# Patient Record
Sex: Male | Born: 1945 | Race: White | Hispanic: No | Marital: Single | Smoking: Never smoker
Health system: Southern US, Community
[De-identification: ages and names within clinical notes are randomized; demographics above are authoritative.]

## PROBLEM LIST (undated history)

## (undated) DIAGNOSIS — C801 Malignant (primary) neoplasm, unspecified: Secondary | ICD-10-CM

## (undated) DIAGNOSIS — I10 Essential (primary) hypertension: Secondary | ICD-10-CM

## (undated) DIAGNOSIS — E119 Type 2 diabetes mellitus without complications: Secondary | ICD-10-CM

---

## 2013-03-19 ENCOUNTER — Other Ambulatory Visit (HOSPITAL_COMMUNITY)
Admission: RE | Admit: 2013-03-19 | Discharge: 2013-03-19 | Disposition: A | Discharge status: Home / Self Care | Payer: Medicare Other | Source: Ambulatory Visit | Attending: Oncology | Admitting: Oncology

## 2013-03-19 DIAGNOSIS — D72820 Lymphocytosis (symptomatic): Secondary | ICD-10-CM | POA: Insufficient documentation

## 2015-05-22 ENCOUNTER — Telehealth: Payer: Self-pay

## 2015-05-22 DIAGNOSIS — I714 Abdominal aortic aneurysm, without rupture, unspecified: Secondary | ICD-10-CM

## 2015-05-22 NOTE — Telephone Encounter (Signed)
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 They spoke to a cardiologist and their office will contact patient and get him seen this week but Bridgitte said she was not sure if patient will understand the importance and actually go to that appointment, she states that cardiologist will be out of town next week and asked [place] to check on patient next week to make sure he went and if not if Dr. Rosanna Randy can see him for this issue. Will keep this message and call patient next week to make sure. -aa

## 2015-05-27 NOTE — Telephone Encounter (Signed)
Rodney Burton, Dr. Rosanna Randy said he has a cardiologist, it is Dr. Nehemiah Massed.  They have an appointment with him on July 18, do you think we can get anything sooner.

## 2015-05-28 ENCOUNTER — Telehealth: Payer: Self-pay

## 2015-05-28 NOTE — Telephone Encounter (Signed)
Ana,I think you may have put an order in for wrong pt.This pt has never been in our office

## 2015-05-28 NOTE — Telephone Encounter (Signed)
See below

## 2015-05-28 NOTE — Telephone Encounter (Signed)
Please refer to vascular surgeon for abdominal aneyrism, thank you-aa

## 2016-11-03 DIAGNOSIS — C9110 Chronic lymphocytic leukemia of B-cell type not having achieved remission: Secondary | ICD-10-CM | POA: Diagnosis not present

## 2016-11-03 DIAGNOSIS — M6281 Muscle weakness (generalized): Secondary | ICD-10-CM | POA: Diagnosis not present

## 2017-05-12 DIAGNOSIS — C9110 Chronic lymphocytic leukemia of B-cell type not having achieved remission: Secondary | ICD-10-CM | POA: Diagnosis not present

## 2017-11-07 DIAGNOSIS — C9110 Chronic lymphocytic leukemia of B-cell type not having achieved remission: Secondary | ICD-10-CM

## 2018-04-03 ENCOUNTER — Ambulatory Visit: Payer: Medicare Other | Admitting: Podiatry

## 2018-05-01 ENCOUNTER — Ambulatory Visit: Payer: Medicare Other | Admitting: Podiatry

## 2018-05-05 ENCOUNTER — Encounter: Payer: Self-pay | Admitting: Sports Medicine

## 2018-05-05 ENCOUNTER — Ambulatory Visit (INDEPENDENT_AMBULATORY_CARE_PROVIDER_SITE_OTHER): Payer: Medicare Other | Admitting: Sports Medicine

## 2018-05-05 VITALS — BP 104/68 | HR 96 | Temp 98.7°F | Resp 16

## 2018-05-05 DIAGNOSIS — M79675 Pain in left toe(s): Secondary | ICD-10-CM | POA: Diagnosis not present

## 2018-05-05 DIAGNOSIS — B351 Tinea unguium: Secondary | ICD-10-CM

## 2018-05-05 DIAGNOSIS — E119 Type 2 diabetes mellitus without complications: Secondary | ICD-10-CM | POA: Diagnosis not present

## 2018-05-05 DIAGNOSIS — B359 Dermatophytosis, unspecified: Secondary | ICD-10-CM

## 2018-05-05 DIAGNOSIS — M79674 Pain in right toe(s): Secondary | ICD-10-CM | POA: Diagnosis not present

## 2018-05-05 MED ORDER — TERBINAFINE HCL 1 % EX SOLN: CUTANEOUS | 5 refills | Status: AC

## 2018-05-05 NOTE — Progress Notes (Signed)
   Subjective:    Patient ID: Rodney Burton, male    DOB: Jun 26, 1946, 72 y.o.   MRN: [id number]  HPI    Review of Systems  Musculoskeletal: Positive for gait problem.  All other systems reviewed and are negative.      Objective:   Physical Exam        Assessment & Plan:

## 2018-05-05 NOTE — Progress Notes (Signed)
Subjective: Rodney Burton is a 72 y.o. male patient with history of diabetes who presents to office today complaining of long,mildly painful nails  while ambulating in shoes; unable to trim. Patient does not know his blood sugar or last visit to his primary care doctor.  Patient is assisted by facility caregiver who assists with providing information that patient is in the diabetic.  Patient admits that he has difficulty with walking and standing that is why he uses a walker however denies any other pedal complaints or concerns at this time.  Review of Systems  Musculoskeletal:       Gait problem uses walker  All other systems reviewed and are negative.   There are no active problems to display for this patient.  Current Outpatient Medications on File Prior to Visit  Medication Sig Dispense Refill  . acetaminophen (TYLENOL) 500 MG tablet Take 500 mg by mouth every 6 (six) hours as needed.    Marland Kitchen allopurinol (ZYLOPRIM) 100 MG tablet Take 100 mg by mouth daily.    Marland Kitchen ALPRAZolam (XANAX) 0.5 MG tablet Take 0.5 mg by mouth at bedtime as needed for anxiety.    Marland Kitchen aspirin EC 81 MG tablet Take 81 mg by mouth daily.    . clotrimazole (LOTRIMIN) 1 % cream Apply 1 application topically 2 (two) times daily.    . ergocalciferol (VITAMIN D2) 50000 units capsule Take 50,000 Units by mouth once a week.    Marland Kitchen lisinopril (PRINIVIL,ZESTRIL) 20 MG tablet Take 20 mg by mouth daily.    . metFORMIN (GLUCOPHAGE) 500 MG tablet Take by mouth 2 (two) times daily with a meal.    . metoprolol succinate (TOPROL-XL) 25 MG 24 hr tablet Take 25 mg by mouth daily.    . polycarbophil (FIBERCON) 625 MG tablet Take 625 mg by mouth daily.     No current facility-administered medications on file prior to visit.    Allergies not on file  No results found for this or any previous visit (from the past 2160 hour(s)).  Objective: General: Patient is awake, alert, and oriented x 3 and in no acute distress.  Integument: Skin is warm,  dry and supple bilateral. Nails are tender, long, thickened and  dystrophic with subungual debris, consistent with onychomycosis, 1-5 bilateral.  There is a scaly rash to both feet and lower legs in a moccasin distribution consistent with tinea.  No signs of infection. No open lesions or preulcerative lesions present bilateral.  Mild reactive callusing plantar aspect of right foot and distal tuft of left second toe.  Remaining integument unremarkable.  Vasculature:  Dorsalis Pedis pulse 1/4 bilateral. Posterior Tibial pulse 1 /4 bilateral.  Capillary fill time <3 sec 1-5 bilateral. Positive hair growth to the level of the digits. Temperature gradient within normal limits. No varicosities present bilateral. No edema present bilateral.   Neurology: Gross pedal sensation intact via light touch bilateral.  Musculoskeletal: Asymptomatic hammertoe pedal deformities noted bilateral. Muscular strength 5/5 in all lower extremity muscular groups bilateral without pain on range of motion . No tenderness with calf compression bilateral.  Assessment and Plan: Problem List Items Addressed This Visit    None    Visit Diagnoses    Pain due to onychomycosis of toenails of both feet    -  Primary   Relevant Medications   clotrimazole (LOTRIMIN) 1 % cream   Terbinafine HCl 1 % SOLN   Tinea       Relevant Medications   clotrimazole (LOTRIMIN) 1 %  cream   Terbinafine HCl 1 % SOLN   Diabetes mellitus without complication (HCC)       Relevant Medications   metFORMIN (GLUCOPHAGE) 500 MG tablet   aspirin EC 81 MG tablet   lisinopril (PRINIVIL,ZESTRIL) 20 MG tablet     -Examined patient. -Discussed and educated patient on diabetic foot care, especially with  regards to the vascular, neurological and musculoskeletal systems.  -Stressed the importance of good glycemic control and the detriment of not  controlling glucose levels in relation to the foot. -Mechanically debrided all nails 1-5 bilateral using  sterile nail nipper and filed with dremel without incident  -Prescribed Lamisil spray to use at bedtime for likely tinea to both feet and lower extremities -Continue with walker for stability and gait and good supportive shoes -Patient to return  in 3 months for at risk foot care -Patient advised to call the office if any problems or questions arise in the meantime.  Landis Martins, DPM

## 2018-05-08 DIAGNOSIS — C9110 Chronic lymphocytic leukemia of B-cell type not having achieved remission: Secondary | ICD-10-CM

## 2018-08-04 ENCOUNTER — Ambulatory Visit: Payer: Medicare Other | Admitting: Sports Medicine

## 2018-08-30 ENCOUNTER — Encounter: Payer: Self-pay | Admitting: Sports Medicine

## 2018-08-30 ENCOUNTER — Ambulatory Visit (INDEPENDENT_AMBULATORY_CARE_PROVIDER_SITE_OTHER): Payer: Medicare Other | Admitting: Sports Medicine

## 2018-08-30 VITALS — BP 73/44 | HR 90 | Temp 96.4°F | Resp 16

## 2018-08-30 DIAGNOSIS — M79671 Pain in right foot: Secondary | ICD-10-CM

## 2018-08-30 DIAGNOSIS — B351 Tinea unguium: Secondary | ICD-10-CM

## 2018-08-30 DIAGNOSIS — E119 Type 2 diabetes mellitus without complications: Secondary | ICD-10-CM | POA: Diagnosis not present

## 2018-08-30 DIAGNOSIS — M79675 Pain in left toe(s): Secondary | ICD-10-CM | POA: Diagnosis not present

## 2018-08-30 DIAGNOSIS — L97511 Non-pressure chronic ulcer of other part of right foot limited to breakdown of skin: Secondary | ICD-10-CM

## 2018-08-30 DIAGNOSIS — M79674 Pain in right toe(s): Secondary | ICD-10-CM

## 2018-08-30 NOTE — Progress Notes (Signed)
Subjective: Rodney Burton is a 72 y.o. male patient with history of diabetes who presents to office today for evaluation of possible wound at the bottom of the right foot and long,mildly painful nails  while ambulating in shoes; unable to trim. Patient does not know his blood sugar or last visit to his primary care doctor.  Patient is assisted by facility caregiver who assists with providing information that patient is in the diabetic and she will be calling primary care today for recommendation on his low blood pressure.  Facility caregiver reports that nursing staff has been applying a Band-Aid on the right foot but there has not been any drainage redness warmth swelling or any other signs of infection at the right foot.  No other pedal complaints at this time.   There are no active problems to display for this patient.  Current Outpatient Medications on File Prior to Visit  Medication Sig Dispense Refill  . acetaminophen (TYLENOL) 500 MG tablet Take 500 mg by mouth every 6 (six) hours as needed.    Marland Kitchen allopurinol (ZYLOPRIM) 100 MG tablet Take 100 mg by mouth daily.    Marland Kitchen ALPRAZolam (XANAX) 0.5 MG tablet Take 0.5 mg by mouth at bedtime as needed for anxiety.    Marland Kitchen aspirin EC 81 MG tablet Take 81 mg by mouth daily.    . clotrimazole (LOTRIMIN) 1 % cream Apply 1 application topically 2 (two) times daily.    . ergocalciferol (VITAMIN D2) 50000 units capsule Take 50,000 Units by mouth once a week.    Marland Kitchen lisinopril (PRINIVIL,ZESTRIL) 20 MG tablet Take 20 mg by mouth daily.    . metFORMIN (GLUCOPHAGE) 500 MG tablet Take by mouth 2 (two) times daily with a meal.    . metoprolol succinate (TOPROL-XL) 25 MG 24 hr tablet Take 25 mg by mouth daily.    . polycarbophil (FIBERCON) 625 MG tablet Take 625 mg by mouth daily.    . Terbinafine HCl 1 % SOLN Spray to both feet daily at bedtime 125 mL 5   No current facility-administered medications on file prior to visit.    Not on File  No results found for this  or any previous visit (from the past 2160 hour(s)).  Objective: General: Patient is awake, alert, and oriented x 3 and in no acute distress.  Integument: Skin is warm, dry and supple bilateral. Nails are tender, long, thickened and  dystrophic with subungual debris, consistent with onychomycosis, 1-5 bilateral.  There is reactive callus tissue plantar aspect of right foot upon debridement there is a pinpoint ulceration measuring 0.1 x 0.1 x 0.1 with a granular base no surrounding warmth, redness, drainage, or malodor.  Remaining integument unremarkable.  Vasculature:  Dorsalis Pedis pulse 1/4 bilateral. Posterior Tibial pulse 1 /4 bilateral.  Capillary fill time <3 sec 1-5 bilateral. Positive hair growth to the level of the digits. Temperature gradient within normal limits. No varicosities present bilateral. No edema present bilateral.   Neurology: Gross pedal sensation intact via light touch bilateral.  Musculoskeletal: Asymptomatic hammertoe pedal deformities noted bilateral. Muscular strength 5/5 in all lower extremity muscular groups bilateral without pain on range of motion . No tenderness with calf compression bilateral.  Assessment and Plan: Problem List Items Addressed This Visit    None    Visit Diagnoses    Right foot ulcer, limited to breakdown of skin ([place])    -  Primary   [place] submet 2 on right   Diabetes mellitus without complication ([place])  Right foot pain       Pain due to onychomycosis of toenails of both feet         -Examined patient. -Discussed and educated caregivers on diabetic foot care - Excisionally dedbrided ulceration at right plantar forefoot to healthy bleeding borders removing nonviable tissue using a sterile chisel blade. Wound measures post debridement as above.  Wound was debrided to the level of the dermis with viable wound base exposed to promote healing. Hemostasis was achieved with manuel pressure. Patient tolerated procedure well without any  discomfort or anesthesia necessary for this wound debridement.  -Applied antibiotic cream and Band-Aid dressing and advised him to do the same at the facility daily - Advised patient to go to the ER or return to office if the wound worsens or if constitutional symptoms are present. -Mechanically debrided all nails 1-5 bilateral using sterile nail nipper and filed with dremel without incident  -Recommend caregiver to follow-up with PCP about low blood pressure -Continue with walker or wheelchair for stability in gait and good supportive shoes, applied offloading padding to the right insole of shoe -Patient to return in 3 weeks for wound check on right -Patient advised to call the office if any problems or questions arise in the meantime.  Landis Martins, DPM

## 2018-09-03 ENCOUNTER — Emergency Department (HOSPITAL_COMMUNITY)
Admission: EM | Admit: 2018-09-03 | Discharge: 2018-09-03 | Disposition: A | Discharge status: Home / Self Care | Payer: Medicare Other | Attending: Emergency Medicine | Admitting: Emergency Medicine

## 2018-09-03 ENCOUNTER — Emergency Department (HOSPITAL_COMMUNITY): Payer: Medicare Other

## 2018-09-03 ENCOUNTER — Encounter (HOSPITAL_COMMUNITY): Payer: Self-pay | Admitting: Internal Medicine

## 2018-09-03 DIAGNOSIS — Z7982 Long term (current) use of aspirin: Secondary | ICD-10-CM | POA: Insufficient documentation

## 2018-09-03 DIAGNOSIS — I10 Essential (primary) hypertension: Secondary | ICD-10-CM | POA: Diagnosis not present

## 2018-09-03 DIAGNOSIS — F0390 Unspecified dementia without behavioral disturbance: Secondary | ICD-10-CM | POA: Diagnosis not present

## 2018-09-03 DIAGNOSIS — D72820 Lymphocytosis (symptomatic): Secondary | ICD-10-CM | POA: Diagnosis not present

## 2018-09-03 DIAGNOSIS — Z7984 Long term (current) use of oral hypoglycemic drugs: Secondary | ICD-10-CM | POA: Insufficient documentation

## 2018-09-03 DIAGNOSIS — W19XXXA Unspecified fall, initial encounter: Secondary | ICD-10-CM

## 2018-09-03 DIAGNOSIS — E119 Type 2 diabetes mellitus without complications: Secondary | ICD-10-CM | POA: Insufficient documentation

## 2018-09-03 DIAGNOSIS — M79602 Pain in left arm: Secondary | ICD-10-CM | POA: Diagnosis present

## 2018-09-03 DIAGNOSIS — Z79899 Other long term (current) drug therapy: Secondary | ICD-10-CM | POA: Diagnosis not present

## 2018-09-03 HISTORY — DX: Type 2 diabetes mellitus without complications: E11.9

## 2018-09-03 HISTORY — DX: Essential (primary) hypertension: I10

## 2018-09-03 LAB — CBC WITH DIFFERENTIAL/PLATELET
BASOS ABS: 0 10*3/uL (ref 0.0–0.1)
Basophils Relative: 0 %
EOS ABS: 0 10*3/uL (ref 0.0–0.5)
Eosinophils Relative: 0 %
HEMATOCRIT: 36 % — AB (ref 39.0–52.0)
HEMOGLOBIN: 11 g/dL — AB (ref 13.0–17.0)
LYMPHS ABS: 33.8 10*3/uL — AB (ref 0.7–4.0)
LYMPHS PCT: 58 %
MCH: 32 pg (ref 26.0–34.0)
MCHC: 30.6 g/dL (ref 30.0–36.0)
MCV: 104.7 fL — AB (ref 80.0–100.0)
MONOS PCT: 0 %
Monocytes Absolute: 0 10*3/uL — ABNORMAL LOW (ref 0.1–1.0)
NRBC: 0 % (ref 0.0–0.2)
Neutro Abs: 5.9 10*3/uL (ref 1.7–7.7)
Neutrophils Relative %: 10 %
Other: 32 %
Platelets: 173 10*3/uL (ref 150–400)
RBC: 3.44 MIL/uL — ABNORMAL LOW (ref 4.22–5.81)
RDW: 13.2 % (ref 11.5–15.5)
WBC: 58.2 10*3/uL (ref 4.0–10.5)

## 2018-09-03 LAB — COMPREHENSIVE METABOLIC PANEL
ALBUMIN: 3.8 g/dL (ref 3.5–5.0)
ALK PHOS: 37 U/L — AB (ref 38–126)
ALT: 15 U/L (ref 0–44)
AST: 23 U/L (ref 15–41)
Anion gap: 8 (ref 5–15)
BILIRUBIN TOTAL: 0.5 mg/dL (ref 0.3–1.2)
BUN: 15 mg/dL (ref 8–23)
CALCIUM: 9.3 mg/dL (ref 8.9–10.3)
CO2: 24 mmol/L (ref 22–32)
Chloride: 109 mmol/L (ref 98–111)
Creatinine, Ser: 0.78 mg/dL (ref 0.61–1.24)
GFR calc Af Amer: >60 mL/min (ref >60)
Glucose, Bld: 131 mg/dL — ABNORMAL HIGH (ref 70–99)
Potassium: 3.9 mmol/L (ref 3.5–5.1)
Sodium: 141 mmol/L (ref 135–145)
TOTAL PROTEIN: 5.5 g/dL — AB (ref 6.5–8.1)

## 2018-09-03 LAB — URINALYSIS, ROUTINE W REFLEX MICROSCOPIC
BILIRUBIN URINE: NEGATIVE
GLUCOSE, UA: 150 mg/dL — AB
HGB URINE DIPSTICK: NEGATIVE
KETONES UR: NEGATIVE mg/dL
Leukocytes, UA: NEGATIVE
Nitrite: NEGATIVE
PROTEIN: NEGATIVE mg/dL
Specific Gravity, Urine: 1.018 (ref 1.005–1.030)
pH: 5 (ref 5.0–8.0)

## 2018-09-03 MED ORDER — ACETAMINOPHEN 325 MG PO TABS
650.0000 mg | ORAL_TABLET | Freq: Once | ORAL | Status: AC
  Administered 2018-09-03: 650 mg via ORAL
  Filled 2018-09-03: qty 2

## 2018-09-03 NOTE — Discharge Instructions (Addendum)
You evaluation for injury after the fall was negative in the ER today. However, you were found to have elevated white blood cell count and lymphocytes.  This is concerning for possible leukemia.  You need to follow-up with your primary care and cancer doctors for further evaluation. If you are having pain, use Tylenol as needed.  You may use ice or heat to help with musculoskeletal pain. Return to the emergency room with high fevers, change in mental status, or any new or concerning symptoms.

## 2018-09-03 NOTE — ED Notes (Signed)
ED Provider at bedside. 

## 2018-09-03 NOTE — ED Triage Notes (Signed)
Pt here for evaluation of left arm pain that is a result of a fall that happened last night at his nursing facility. Staff at facility were not aware of a fall. Pt reported fall to EMS. Hx of dementia. Pt takes aspirin once a day. No obvious injuries or deformities noted to head/neck.

## 2018-09-03 NOTE — ED Provider Notes (Signed)
[facility] EMERGENCY DEPARTMENT Provider Note   CSN: [id number] Arrival date & time: 09/03/18  1001     History   Chief Complaint No chief complaint on file.   HPI Rodney Burton is a 72 y.o. male presenting for evaluation after a fall.  Level 5 caveat, patient with dementia.  Patient states that he fell last night.  His location of where he hurts varies, occasionally complains of left arm pain.  Occasionally complains of neck pain.  Unable to provide a history due to dementia.  Additional history obtained from Rodney Burton, who works with [facility].  She takes care of the patient.  Per Rodney Burton, patient had a witnessed fall last night when he was getting into bed.  Patient fell backwards, landing on his back.  She denies loss of consciousness.  Patient takes an aspirin daily, is not on blood thinners.  At baseline, patient can stand on his own, but last night and today he appeared weaker.  Rodney Burton states patient has had no obvious complaints or illnesses recently.  No obvious fever.  No obvious cough or vomiting.  Patient was seen in [place] ER last week due to hypotension, both his life lisinopril and metoprolol were discontinued.  Patient has been eating and drinking well.  This morning, they were concerned about patient's blood pressure, 938 systolic.  Heart rate was 126 at the time.  30 minutes later, patient's blood pressure was 101 systolic, heart rate 751.  For this reason, patient was sent to the emergency room. Pt has an appointment with his primary care doctor tomorrow.  Additional history obtained from chart review.  Patient recently seen by podiatry for foot ulcer.  No antibiotics were prescribed.  Patient with a history of diabetes, ?control.   HPI  Past Medical History:  Diagnosis Date  . Diabetes ([place])    type 2  . Hypertension     There are no active problems to display for this patient.   No past surgical history on  file.      Home Medications    Prior to Admission medications   Medication Sig Start Date End Date Taking? Authorizing Provider  acetaminophen (TYLENOL) 500 MG tablet Take 500 mg by mouth every 6 (six) hours as needed.    [provider]  allopurinol (ZYLOPRIM) 100 MG tablet Take 100 mg by mouth daily.    [provider]  ALPRAZolam Duanne Moron) 0.5 MG tablet Take 0.5 mg by mouth at bedtime as needed for anxiety.    [provider]  aspirin EC 81 MG tablet Take 81 mg by mouth daily.    [provider]  clotrimazole (LOTRIMIN) 1 % cream Apply 1 application topically 2 (two) times daily.    [provider]  ergocalciferol (VITAMIN D2) 50000 units capsule Take 50,000 Units by mouth once a week.    [provider]  lisinopril (PRINIVIL,ZESTRIL) 20 MG tablet Take 20 mg by mouth daily.    [provider]  metFORMIN (GLUCOPHAGE) 500 MG tablet Take by mouth 2 (two) times daily with a meal.    [provider]  metoprolol succinate (TOPROL-XL) 25 MG 24 hr tablet Take 25 mg by mouth daily.    [provider]  polycarbophil (FIBERCON) 625 MG tablet Take 625 mg by mouth daily.    [provider]  Terbinafine HCl 1 % SOLN Spray to both feet daily at bedtime 05/05/18   Landis Martins, DPM    Family History No  family history on file.  Social History Social History   Tobacco Use  . Smoking status: Never Smoker  . Smokeless tobacco: Never Used  Substance Use Topics  . Alcohol use: Not on file  . Drug use: Not on file     Allergies   Patient has no allergy information on record.   Review of Systems Review of Systems  Unable to perform ROS: Dementia  Musculoskeletal: Positive for arthralgias and neck pain.  Neurological: Positive for weakness (possible).  Hematological: Does not bruise/bleed easily.     Physical Exam Updated Vital Signs BP (!) 145/85   Pulse 97   Temp 98.1 F (36.7 C) (Oral)    Resp 16   SpO2 94%   Physical Exam  Constitutional: He is oriented to person, place, and time. He appears well-developed and well-nourished. No distress.  Thin elderly male in no acute distress  HENT:  Head: Normocephalic and atraumatic.  No obvious head injury.  Eyes: Pupils are equal, round, and reactive to light. Conjunctivae and EOM are normal.  EOMI and PERRLA  Neck: Normal range of motion. Neck supple.  No tenderness to palpation of midline C-spine.  No obvious step offs or deformities.  Moving head easily and exam.  Cardiovascular: Normal rate, regular rhythm and intact distal pulses.  No tachycardia  Pulmonary/Chest: Effort normal and breath sounds normal. No respiratory distress. He has no wheezes.     He exhibits no tenderness.  Speaking in full sentences.  Clear lung sounds in all fields.  Tenderness palpation of the ribs, chest, or thoracic back.  Patient with abrasion over right scapula.  Abdominal: Soft. He exhibits no distension and no mass. There is no tenderness. There is no guarding.  Genitourinary:  Genitourinary Comments: Diaper rash of the anterior and posterior pelvis  Musculoskeletal: Normal range of motion. He exhibits no deformity.  Bruising of the distal left forearm.  Radial pulses intact bilaterally.  Grip strength intact bilaterally.  Full active range of motion of shoulders, elbows, and wrist. No tenderness to palpation of back or midline spine. No TTP lower extremities. Bilateral foot coolness with good pedal pulses. Appears chronic. Ulcer noted to R plantarfoot  Neurological: He is alert and oriented to person, place, and time. No sensory deficit.  Alert and oriented. Staff states pt is baseline mental status. Pt easily confused, h/o dementia  Skin: Skin is warm and dry. Capillary refill takes less than 2 seconds.  Psychiatric: He has a normal mood and affect.  Nursing note and vitals reviewed.    ED Treatments / Results  Labs (all labs ordered  are listed, but only abnormal results are displayed) Labs Reviewed  CBC WITH DIFFERENTIAL/PLATELET - Abnormal; Notable for the following components:      Result Value   WBC 58.2 (*)    RBC 3.44 (*)    Hemoglobin 11.0 (*)    HCT 36.0 (*)    MCV 104.7 (*)    Lymphs Abs 33.8 (*)    Monocytes Absolute 0.0 (*)    All other components within normal limits  COMPREHENSIVE METABOLIC PANEL - Abnormal; Notable for the following components:   Glucose, Bld 131 (*)    Total Protein 5.5 (*)    Alkaline Phosphatase 37 (*)    All other components within normal limits  URINALYSIS, ROUTINE W REFLEX MICROSCOPIC - Abnormal; Notable for the following components:   Glucose, UA 150 (*)    All other components within normal limits  URINE CULTURE  PATHOLOGIST  SMEAR REVIEW    EKG None  Radiology Dg Chest 2 View  Result Date: 09/03/2018 CLINICAL DATA:  Fall EXAM: CHEST - 2 VIEW COMPARISON:  None. FINDINGS: Decreased lung volume with mild atelectasis in the bases. Negative for heart failure or effusion. Moderate compression fracture of T11 is chronic and unchanged from prior studies. No acute fracture. IMPRESSION: Hypoventilation with mild bibasilar atelectasis. Electronically Signed   By: Franchot Gallo M.D.   On: 09/03/2018 11:47   Dg Elbow Complete Left  Result Date: 09/03/2018 CLINICAL DATA:  Recent fall with left elbow pain, initial encounter EXAM: LEFT ELBOW - COMPLETE 3+ VIEW COMPARISON:  None. FINDINGS: No acute fracture or dislocation is noted. Mild degenerative changes are seen. No joint effusion is noted. IMPRESSION: Mild degenerative change without acute abnormality. Electronically Signed   By: Inez Catalina M.D.   On: 09/03/2018 11:57   Dg Wrist Complete Left  Result Date: 09/03/2018 CLINICAL DATA:  Recent fall with wrist pain, initial encounter EXAM: LEFT WRIST - COMPLETE 3+ VIEW COMPARISON:  None. FINDINGS: Degenerative changes are noted the first [facility] joint as well as the radiocarpal joint.  Cartilaginous calcifications are noted. No acute fracture or dislocation is seen. IMPRESSION: Chronic changes without acute abnormality. Electronically Signed   By: Inez Catalina M.D.   On: 09/03/2018 11:53   Ct Head Wo Contrast  Result Date: 09/03/2018 CLINICAL DATA:  Fall yesterday with headaches and neck pain, initial encounter EXAM: CT HEAD WITHOUT CONTRAST CT CERVICAL SPINE WITHOUT CONTRAST TECHNIQUE: Multidetector CT imaging of the head and cervical spine was performed following the standard protocol without intravenous contrast. Multiplanar CT image reconstructions of the cervical spine were also generated. COMPARISON:  None. FINDINGS: CT HEAD FINDINGS Brain: Chronic atrophic changes and white matter ischemic changes are noted. No findings to suggest acute hemorrhage, acute infarction or space-occupying mass lesion are seen. Vascular: No hyperdense vessel or unexpected calcification. Skull: Normal. Negative for fracture or focal lesion. Sinuses/Orbits: No acute finding. Other: None. CT CERVICAL SPINE FINDINGS Alignment: Mild retrolisthesis of C4 with respect to C3 and C5 is noted. Skull base and vertebrae: 7 cervical segments are well visualized. Vertebral body height is well maintained. Multilevel facet hypertrophic changes and osteophytic changes are seen worst at C4-5. No acute fracture or acute facet abnormality is noted. Soft tissues and spinal canal: No soft tissue abnormality is identified. Mild central canal stenosis is noted related to the degenerative change. Upper chest: Negative. Other: None IMPRESSION: CT of the head: Chronic changes without acute abnormality. CT of the cervical spine: Multilevel degenerative changes without acute bony abnormality. Electronically Signed   By: Inez Catalina M.D.   On: 09/03/2018 12:12   Ct Cervical Spine Wo Contrast  Result Date: 09/03/2018 CLINICAL DATA:  Fall yesterday with headaches and neck pain, initial encounter EXAM: CT HEAD WITHOUT CONTRAST CT  CERVICAL SPINE WITHOUT CONTRAST TECHNIQUE: Multidetector CT imaging of the head and cervical spine was performed following the standard protocol without intravenous contrast. Multiplanar CT image reconstructions of the cervical spine were also generated. COMPARISON:  None. FINDINGS: CT HEAD FINDINGS Brain: Chronic atrophic changes and white matter ischemic changes are noted. No findings to suggest acute hemorrhage, acute infarction or space-occupying mass lesion are seen. Vascular: No hyperdense vessel or unexpected calcification. Skull: Normal. Negative for fracture or focal lesion. Sinuses/Orbits: No acute finding. Other: None. CT CERVICAL SPINE FINDINGS Alignment: Mild retrolisthesis of C4 with respect to C3 and C5 is noted. Skull base and vertebrae: 7 cervical segments  are well visualized. Vertebral body height is well maintained. Multilevel facet hypertrophic changes and osteophytic changes are seen worst at C4-5. No acute fracture or acute facet abnormality is noted. Soft tissues and spinal canal: No soft tissue abnormality is identified. Mild central canal stenosis is noted related to the degenerative change. Upper chest: Negative. Other: None IMPRESSION: CT of the head: Chronic changes without acute abnormality. CT of the cervical spine: Multilevel degenerative changes without acute bony abnormality. Electronically Signed   By: Inez Catalina M.D.   On: 09/03/2018 12:12   Dg Shoulder Left  Result Date: 09/03/2018 CLINICAL DATA:  Left shoulder pain following fall, initial encounter EXAM: LEFT SHOULDER - 2+ VIEW COMPARISON:  None. FINDINGS: Degenerative changes of the acromioclavicular joint are seen. No acute fracture or dislocation is noted. No soft tissue abnormality is noted. IMPRESSION: Chronic changes without acute abnormality. Electronically Signed   By: Inez Catalina M.D.   On: 09/03/2018 12:01   Dg Foot Complete Right  Result Date: 09/03/2018 CLINICAL DATA:  Recent fall with foot pain, initial  encounter EXAM: RIGHT FOOT COMPLETE - 3+ VIEW COMPARISON:  None. FINDINGS: Tarsal degenerative changes are noted. No acute fracture or dislocation is noted. No soft tissue wound is seen. Diffuse vascular calcifications are noted. IMPRESSION: Degenerative change without acute abnormality. Electronically Signed   By: Inez Catalina M.D.   On: 09/03/2018 12:16    Procedures Procedures (including critical care time)  Medications Ordered in ED Medications  acetaminophen (TYLENOL) tablet 650 mg (650 mg Oral Given 09/03/18 1242)     Initial Impression / Assessment and Plan / ED Course  I have reviewed the triage vital signs and the nursing notes.  Pertinent labs & imaging results that were available during my care of the patient were reviewed by me and considered in my medical decision making (see chart for details).     Patient presenting for evaluation after a fall.  Physical exam reassuring, no obvious deformities.  However, patient with a history of dementia, I do not feel I can trust his history.  Will obtain imaging of his head and neck, left upper extremity, and chest.  Will obtain imaging of his right foot to rule out osteo-due to this chronic ulcer noted on his foot.  Additionally, basic lab work obtained to assess for infection, including chest x-ray and UA.  X-rays viewed and interpreted by me, no fractures or dislocations.  No obvious osteo-myelitis.  CT head and neck negative for bleed, break, or swelling.  Urine negative for infection.  CMP reassuring.  CBC pending.  CBC concerning, patient with significant leukocytosis at 58.4 with elevated lymphocytes and smudge cells.  Concern for leukemia.  Patient is stable at this time.  I called pt's caregiver, Rodney Burton, and informed her of the results.  At this time, I was notified that patient was in remission for leukemia, and was found to have elevated white count at [facility] ER last week.  He already has an appointment with his cancer doctor,  and has a follow-up appointment with his primary care doctor tomorrow. This time, patient appears safe for discharge with further work-up to be performed outpatient regarding leukocytosis.  Strict return precautions given.  Patient to treat pain with Tylenol as needed.  Caregiver states she understands and agrees to plan.   Final Clinical Impressions(s) / ED Diagnoses   Final diagnoses:  Fall, initial encounter  Lymphocytosis    ED Discharge Orders    None  Franchot Heidelberg, PA-C 09/03/18 1455    Charlesetta Shanks, MD 09/16/18 1558

## 2018-09-03 NOTE — ED Notes (Signed)
Called [facility] for pt transport  

## 2018-09-03 NOTE — ED Provider Notes (Signed)
Medical screening examination/treatment/procedure(s) were conducted as a shared visit with non-physician practitioner(s) and myself.  I personally evaluated the patient during the encounter.  None Patient had a witnessed fall last night.  He fell backwards landing on his back.  No loss of consciousness.  Patient complains of pain to the left arm.  Patient is alert and pleasant.  No distress.  Normal cephalic atraumatic.  Extraocular motions intact.  Heart regular.  Lungs grossly clear.  Patient has bruising on the dorsum of the hand in the left arm but normal range of motion without deformity.  Patient is pleasant and follows commands without difficulty.  I agree with plan of management.   Charlesetta Shanks, MD 09/03/18 1400

## 2018-09-04 LAB — URINE CULTURE

## 2018-09-05 LAB — PATHOLOGIST SMEAR REVIEW: Path Review: INCREASED

## 2018-09-07 DIAGNOSIS — C9110 Chronic lymphocytic leukemia of B-cell type not having achieved remission: Secondary | ICD-10-CM | POA: Diagnosis not present

## 2018-09-20 ENCOUNTER — Encounter: Payer: Self-pay | Admitting: Sports Medicine

## 2018-09-20 ENCOUNTER — Ambulatory Visit (INDEPENDENT_AMBULATORY_CARE_PROVIDER_SITE_OTHER): Payer: Medicare Other | Admitting: Sports Medicine

## 2018-09-20 VITALS — BP 140/85 | HR 91 | Temp 98.0°F | Resp 16

## 2018-09-20 DIAGNOSIS — E119 Type 2 diabetes mellitus without complications: Secondary | ICD-10-CM

## 2018-09-20 DIAGNOSIS — M79671 Pain in right foot: Secondary | ICD-10-CM

## 2018-09-20 DIAGNOSIS — L97511 Non-pressure chronic ulcer of other part of right foot limited to breakdown of skin: Secondary | ICD-10-CM | POA: Diagnosis not present

## 2018-09-20 NOTE — Progress Notes (Signed)
Subjective: Rodney Burton is a 72 y.o. male patient with history of diabetes who presents to office today for follow-up evaluation of right foot ulceration.  Patient is assisted by facility caregiver who reports that the wound fine and seems like it is getting better.  They have been applying Neosporin and Band-Aid and the caregiver reports that he has been nonambulatory because he physically stopped wanting to walk and currently presents today to office on a stretcher and states that he recently was seen in the ER for dehydration and treated for his leukemia and has been dealing with lots of diarrhea denies nausea vomiting fever or chills.  No other symptoms noted.  No other pedal complaints at this time.   There are no active problems to display for this patient.  Current Outpatient Medications on File Prior to Visit  Medication Sig Dispense Refill  . acetaminophen (TYLENOL) 500 MG tablet Take 500 mg by mouth every 6 (six) hours as needed.    Marland Kitchen allopurinol (ZYLOPRIM) 100 MG tablet Take 100 mg by mouth daily.    Marland Kitchen ALPRAZolam (XANAX) 0.5 MG tablet Take 0.5 mg by mouth at bedtime as needed for anxiety.    Marland Kitchen aspirin EC 81 MG tablet Take 81 mg by mouth daily.    . clotrimazole (LOTRIMIN) 1 % cream Apply 1 application topically 2 (two) times daily.    . ergocalciferol (VITAMIN D2) 50000 units capsule Take 50,000 Units by mouth once a week.    Marland Kitchen lisinopril (PRINIVIL,ZESTRIL) 20 MG tablet Take 20 mg by mouth daily.    Marland Kitchen loperamide (IMODIUM) 2 MG capsule TAKE 2 CAPSULES BY MOUTH AFTER FIRST DIARRHEA EPISODE, THEN 1 CAPSULE AFTER EACH LOOSE STOOL MAX 4 CAPSULES PER DAY  0  . metFORMIN (GLUCOPHAGE) 500 MG tablet Take by mouth 2 (two) times daily with a meal.    . metoprolol succinate (TOPROL-XL) 25 MG 24 hr tablet Take 25 mg by mouth daily.    . polycarbophil (FIBERCON) 625 MG tablet Take 625 mg by mouth daily.    . Terbinafine HCl 1 % SOLN Spray to both feet daily at bedtime 125 mL 5   No current  facility-administered medications on file prior to visit.    Not on File  Recent Results (from the past 2160 hour(s))  Urine culture     Status: None   Collection Time: 09/03/18 10:36 AM  Result Value Ref Range   Specimen Description URINE, CLEAN CATCH    Special Requests      NONE Performed at [facility], [address] [place], [place] [address]    Culture      Multiple bacterial morphotypes present, none predominant. Suggest appropriate recollection if clinically indicated.   Report Status 09/04/2018 FINAL   Urinalysis, Routine w reflex microscopic     Status: Abnormal   Collection Time: 09/03/18 10:39 AM  Result Value Ref Range   Color, Urine YELLOW YELLOW   APPearance CLEAR CLEAR   Specific Gravity, Urine 1.018 1.005 - 1.030   pH 5.0 5.0 - 8.0   Glucose, UA 150 (A) NEGATIVE mg/dL   Hgb urine dipstick NEGATIVE NEGATIVE   Bilirubin Urine NEGATIVE NEGATIVE   Ketones, ur NEGATIVE NEGATIVE mg/dL   Protein, ur NEGATIVE NEGATIVE mg/dL   Nitrite NEGATIVE NEGATIVE   Leukocytes, UA NEGATIVE NEGATIVE    Comment: Performed at [facility] [address] [place], [place] [address]  CBC with Differential     Status: Abnormal   Collection  Time: 09/03/18 11:33 AM  Result Value Ref Range   WBC 58.2 (HH) 4.0 - 10.5 K/uL    Comment: This result has been called to C.KING,RN by Jacki Cones on 10 13 2019 at 1319, and has been read back. CRITICAL VERIFIED REPEATED TO VERIFY CORRECTED ON 10/13 AT 1752: PREVIOUSLY REPORTED AS 58.2 This result has been called to C.KING,RN by Jacki Cones on 10 13 2019 at 1319, and has been read back. CRITICAL VERIFIED    RBC 3.44 (L) 4.22 - 5.81 MIL/uL   Hemoglobin 11.0 (L) 13.0 - 17.0 g/dL   HCT 36.0 (L) 39.0 - 52.0 %   MCV 104.7 (H) 80.0 - 100.0 fL   MCH 32.0 26.0 - 34.0 pg   MCHC 30.6 30.0 - 36.0 g/dL   RDW 13.2 11.5 - 15.5 %   Platelets 173 150 - 400 K/uL   nRBC 0.0 0.0 - 0.2 %   Neutrophils Relative % 10 %   Neutro Abs 5.9 1.7 -  7.7 K/uL   Lymphocytes Relative 58 %   Lymphs Abs 33.8 (H) 0.7 - 4.0 K/uL   Monocytes Relative 0 %   Monocytes Absolute 0.0 (L) 0.1 - 1.0 K/uL   Eosinophils Relative 0 %   Eosinophils Absolute 0.0 0.0 - 0.5 K/uL   Basophils Relative 0 %   Basophils Absolute 0.0 0.0 - 0.1 K/uL   WBC Morphology See Note     Comment: Atypical Mononuclear Cells Abnormal Lymphocytes Present Absolute Lymphocytosis Smudge Cells   Other 32 %    Comment: Performed at [facility], [place] [address] [place], [place] [address]  Comprehensive metabolic panel     Status: Abnormal   Collection Time: 09/03/18 11:33 AM  Result Value Ref Range   Sodium 141 135 - 145 mmol/L   Potassium 3.9 3.5 - 5.1 mmol/L   Chloride 109 98 - 111 mmol/L   CO2 24 22 - 32 mmol/L   Glucose, Bld 131 (H) 70 - 99 mg/dL   BUN 15 8 - 23 mg/dL   Creatinine, Ser 0.78 0.61 - 1.24 mg/dL   Calcium 9.3 8.9 - 10.3 mg/dL   Total Protein 5.5 (L) 6.5 - 8.1 g/dL   Albumin 3.8 3.5 - 5.0 g/dL   AST 23 15 - 41 U/L   ALT 15 0 - 44 U/L   Alkaline Phosphatase 37 (L) 38 - 126 U/L   Total Bilirubin 0.5 0.3 - 1.2 mg/dL   GFR calc non Af Amer >60 >60 mL/min   GFR calc Af Amer >60 >60 mL/min    Comment: (NOTE) The eGFR has been calculated using the CKD EPI equation. This calculation has not been validated in all clinical situations. eGFR's persistently <60 mL/min signify possible Chronic Kidney Disease.    Anion gap 8 5 - 15    Comment: Performed at [facility] [address], [place], [place] [address]  Pathologist smear review     Status: None   Collection Time: 09/03/18 11:33 AM  Result Value Ref Range   Path Review      Features concerning for CLL with increased prolymphs.    Comment: Recommend flow. Reviewed by Marlynn Perking. Melina Copa, M.D. 09/05/2018 Performed at [facility], [place] [address] [place], [place] [address]     Objective: General: Patient is awake, alert, and oriented x 2 and in no acute distress.  Possibly worsening  dementia.  Integument: Skin is warm, dry and supple bilateral. Nails are short,  thickened and  dystrophic with subungual debris, consistent with onychomycosis, 1-5 bilateral.  There is reactive callus tissue plantar aspect of right foot with no opening previous ulceration appears well-healed.  Remaining integument unremarkable.  Vasculature:  Dorsalis Pedis pulse 1/4 bilateral. Posterior Tibial pulse 1 /4 bilateral.  Capillary fill time <3 sec 1-5 bilateral. Positive hair growth to the level of the digits. Temperature gradient within normal limits. No varicosities present bilateral. No edema present bilateral.   Neurology: Gross pedal sensation intact via light touch bilateral.  Musculoskeletal: Asymptomatic hammertoe pedal deformities noted bilateral. Muscular strength 5/5 in all lower extremity muscular groups bilateral without pain on range of motion for patient status. No tenderness with calf compression bilateral.  Assessment and Plan: Problem List Items Addressed This Visit    None    Visit Diagnoses    Right foot ulcer, limited to breakdown of skin ([place])    -  Primary   healed   Diabetes mellitus without complication ([place])       Right foot pain         -Examined patient. -Discussed and educated caregivers on preventative care for healed wound right foot -Applied offloading pad and Band-Aid to area and advised patient and caregivers to do the same prevent recurrence of ulceration -Continue with bedrest and no ambulation until evaluated by neurologist -Patient to return in 9 weeks for routine diabetic foot care -Patient advised to call the office if any problems or questions arise in the meantime.  Landis Martins, DPM

## 2018-10-13 ENCOUNTER — Encounter (HOSPITAL_COMMUNITY): Payer: Self-pay | Admitting: Emergency Medicine

## 2018-10-13 ENCOUNTER — Emergency Department (HOSPITAL_COMMUNITY): Payer: Medicare Other

## 2018-10-13 ENCOUNTER — Emergency Department (HOSPITAL_COMMUNITY)
Admission: EM | Admit: 2018-10-13 | Discharge: 2018-10-14 | Disposition: A | Discharge status: Home / Self Care | Payer: Medicare Other | Attending: Emergency Medicine | Admitting: Emergency Medicine

## 2018-10-13 DIAGNOSIS — Z79899 Other long term (current) drug therapy: Secondary | ICD-10-CM | POA: Diagnosis not present

## 2018-10-13 DIAGNOSIS — J069 Acute upper respiratory infection, unspecified: Secondary | ICD-10-CM | POA: Insufficient documentation

## 2018-10-13 DIAGNOSIS — Z7984 Long term (current) use of oral hypoglycemic drugs: Secondary | ICD-10-CM | POA: Diagnosis not present

## 2018-10-13 DIAGNOSIS — D72829 Elevated white blood cell count, unspecified: Secondary | ICD-10-CM | POA: Diagnosis not present

## 2018-10-13 DIAGNOSIS — I10 Essential (primary) hypertension: Secondary | ICD-10-CM | POA: Diagnosis not present

## 2018-10-13 DIAGNOSIS — R05 Cough: Secondary | ICD-10-CM | POA: Diagnosis present

## 2018-10-13 DIAGNOSIS — E119 Type 2 diabetes mellitus without complications: Secondary | ICD-10-CM | POA: Insufficient documentation

## 2018-10-13 HISTORY — DX: Malignant (primary) neoplasm, unspecified: C80.1

## 2018-10-13 LAB — URINALYSIS, ROUTINE W REFLEX MICROSCOPIC
Bilirubin Urine: NEGATIVE
Glucose, UA: 50 mg/dL — AB
Hgb urine dipstick: NEGATIVE
Ketones, ur: 5 mg/dL — AB
LEUKOCYTES UA: NEGATIVE
Nitrite: NEGATIVE
PROTEIN: NEGATIVE mg/dL
Specific Gravity, Urine: 1.019 (ref 1.005–1.030)
pH: 5 (ref 5.0–8.0)

## 2018-10-13 LAB — CBC
HCT: 33.3 % — ABNORMAL LOW (ref 39.0–52.0)
HEMOGLOBIN: 10.3 g/dL — AB (ref 13.0–17.0)
MCH: 32.8 pg (ref 26.0–34.0)
MCHC: 30.9 g/dL (ref 30.0–36.0)
MCV: 106.1 fL — AB (ref 80.0–100.0)
Platelets: 183 10*3/uL (ref 150–400)
RBC: 3.14 MIL/uL — ABNORMAL LOW (ref 4.22–5.81)
RDW: 13.2 % (ref 11.5–15.5)
WBC: 54.1 10*3/uL — AB (ref 4.0–10.5)
nRBC: 0 % (ref 0.0–0.2)

## 2018-10-13 LAB — BASIC METABOLIC PANEL
ANION GAP: 9 (ref 5–15)
BUN: 24 mg/dL — ABNORMAL HIGH (ref 8–23)
CHLORIDE: 103 mmol/L (ref 98–111)
CO2: 20 mmol/L — ABNORMAL LOW (ref 22–32)
Calcium: 8.6 mg/dL — ABNORMAL LOW (ref 8.9–10.3)
Creatinine, Ser: 1.08 mg/dL (ref 0.61–1.24)
GFR calc Af Amer: >60 mL/min (ref >60)
Glucose, Bld: 181 mg/dL — ABNORMAL HIGH (ref 70–99)
Potassium: 4.2 mmol/L (ref 3.5–5.1)
SODIUM: 132 mmol/L — AB (ref 135–145)

## 2018-10-13 LAB — I-STAT CG4 LACTIC ACID, ED
LACTIC ACID, VENOUS: 1.56 mmol/L (ref 0.5–1.9)
Lactic Acid, Venous: 1.36 mmol/L (ref 0.5–1.9)

## 2018-10-13 LAB — INFLUENZA PANEL BY PCR (TYPE A & B)
Influenza A By PCR: NEGATIVE
Influenza B By PCR: NEGATIVE

## 2018-10-13 MED ORDER — SODIUM CHLORIDE 0.9 % IV SOLN
1.0000 g | Freq: Once | INTRAVENOUS | Status: AC
  Administered 2018-10-13: 1 g via INTRAVENOUS
  Filled 2018-10-13: qty 10

## 2018-10-13 MED ORDER — SODIUM CHLORIDE 0.9 % IV SOLN
1000.0000 mL | INTRAVENOUS | Status: DC
  Administered 2018-10-13: 1000 mL via INTRAVENOUS

## 2018-10-13 MED ORDER — DOXYCYCLINE HYCLATE 100 MG PO CAPS: 100.0000 mg | ORAL_CAPSULE | Freq: Two times a day (BID) | ORAL | 0 refills | Status: AC

## 2018-10-13 MED ORDER — SODIUM CHLORIDE 0.9 % IV BOLUS (SEPSIS)
1000.0000 mL | Freq: Once | INTRAVENOUS | Status: AC
  Administered 2018-10-13: 1000 mL via INTRAVENOUS

## 2018-10-13 MED ORDER — BENZONATATE 100 MG PO CAPS: 100.0000 mg | ORAL_CAPSULE | Freq: Three times a day (TID) | ORAL | 0 refills | Status: AC

## 2018-10-13 NOTE — Discharge Instructions (Addendum)
Please follow up with your oncologist regarding your white blood cell count.  It remains very high.  If you do not have one contact the [facility]. Take the antibiotics as prescribed  Your xray today does not show a pneumonia and there are no signs of a urinary tract or blood stream infection.

## 2018-10-13 NOTE — ED Provider Notes (Signed)
[facility] EMERGENCY DEPARTMENT Provider Note   CSN: [id number] Arrival date & time: 10/13/18  1844     History   Chief Complaint No chief complaint on file.   HPI Rodney Burton is a 72 y.o. male.  HPI Pt presented to the ED for evaluation of cough and fever.  Pt resides in a group home.  He was noted to have a temp up to 101.  Pt states he has been feeling bad with a bad cold.  She has been coughing.  He denies sore throat.  No vomiting or diarrhea.  No CP Past Medical History:  Diagnosis Date  . 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 Hypertension     There are no active problems to display for this patient.   History reviewed. No pertinent surgical history.      Home Medications    Prior to Admission medications   Medication Sig Start Date End Date Taking? Authorizing Provider  acetaminophen (TYLENOL) 500 MG tablet Take 500 mg by mouth every 6 (six) hours as needed.    [provider]  allopurinol (ZYLOPRIM) 100 MG tablet Take 100 mg by mouth daily.    [provider]  ALPRAZolam Duanne Moron) 0.5 MG tablet Take 0.5 mg by mouth at bedtime as needed for anxiety.    [provider]  aspirin EC 81 MG tablet Take 81 mg by mouth daily.    [provider]  benzonatate (TESSALON) 100 MG capsule Take 1 capsule (100 mg total) by mouth every 8 (eight) hours. 10/13/18   Dorie Rank, MD  clotrimazole (LOTRIMIN) 1 % cream Apply 1 application topically 2 (two) times daily.    [provider]  doxycycline (VIBRAMYCIN) 100 MG capsule Take 1 capsule (100 mg total) by mouth 2 (two) times daily for 7 days. 10/13/18 10/20/18  Dorie Rank, MD  ergocalciferol (VITAMIN D2) 50000 units capsule Take 50,000 Units by mouth once a week.    [provider]  lisinopril (PRINIVIL,ZESTRIL) 20 MG tablet Take 20 mg by mouth daily.    [provider]  loperamide (IMODIUM) 2 MG capsule TAKE 2 CAPSULES BY MOUTH AFTER FIRST DIARRHEA EPISODE,  THEN 1 CAPSULE AFTER EACH LOOSE STOOL MAX 4 CAPSULES PER DAY 08/30/18   [provider]  metFORMIN (GLUCOPHAGE) 500 MG tablet Take by mouth 2 (two) times daily with a meal.    [provider]  metoprolol succinate (TOPROL-XL) 25 MG 24 hr tablet Take 25 mg by mouth daily.    [provider]  polycarbophil (FIBERCON) 625 MG tablet Take 625 mg by mouth daily.    [provider]  Terbinafine HCl 1 % SOLN Spray to both feet daily at bedtime 05/05/18   Landis Martins, DPM    Family History No family history on file.  Social History Social History   Tobacco Use  . Smoking status: Never Smoker  . Smokeless tobacco: Never Used  Substance Use Topics  . Alcohol use: Not on file  . Drug use: Not on file     Allergies   Patient has no allergy information on record.   Review of Systems Review of Systems  All other systems reviewed and are negative.    Physical Exam Updated Vital Signs BP 130/69   Pulse (!) 105   Temp 99.8 F (37.7 C) (Rectal)   Resp (!) 22   SpO2 100%   Physical Exam  Constitutional: He appears well-developed and well-nourished. No distress.  HENT:  Head: Normocephalic and atraumatic.  Right Ear: External ear normal.  Left Ear: External ear normal.  Eyes: Conjunctivae are normal. Right eye exhibits no discharge. Left eye exhibits no discharge. No scleral icterus.  Neck: Neck supple. No tracheal deviation present.  Cardiovascular: Normal rate, regular rhythm and intact distal pulses.  Pulmonary/Chest: Effort normal and breath sounds normal. No stridor. No respiratory distress. He has no wheezes. He has no rales.  Frequent cough  Abdominal: Soft. Bowel sounds are normal. He exhibits no distension. There is no tenderness. There is no rebound and no guarding.  Musculoskeletal: He exhibits no edema or tenderness.  Neurological: He is alert. He has normal strength. No cranial nerve deficit (no facial droop, extraocular movements  intact, no slurred speech) or sensory deficit. He exhibits normal muscle tone. He displays no seizure activity. Coordination normal.  Skin: Skin is warm and dry. No rash noted.  Psychiatric: He has a normal mood and affect.  Nursing note and vitals reviewed.    ED Treatments / Results  Labs (all labs ordered are listed, but only abnormal results are displayed) Labs Reviewed  CBC - Abnormal; Notable for the following components:      Result Value   WBC 54.1 (*)    RBC 3.14 (*)    Hemoglobin 10.3 (*)    HCT 33.3 (*)    MCV 106.1 (*)    All other components within normal limits  BASIC METABOLIC PANEL - Abnormal; Notable for the following components:   Sodium 132 (*)    CO2 20 (*)    Glucose, Bld 181 (*)    BUN 24 (*)    Calcium 8.6 (*)    All other components within normal limits  URINALYSIS, ROUTINE W REFLEX MICROSCOPIC - Abnormal; Notable for the following components:   Glucose, UA 50 (*)    Ketones, ur 5 (*)    All other components within normal limits  INFLUENZA PANEL BY PCR (TYPE A & B)  I-STAT CG4 LACTIC ACID, ED  I-STAT CG4 LACTIC ACID, ED    EKG None  Radiology Dg Chest 2 View  Result Date: 10/13/2018 CLINICAL DATA:  Dry cough for 1 week. Finished antibiotics. Fever. Possible pneumonia. EXAM: CHEST - 2 VIEW COMPARISON:  Radiographs 09/03/2018 and 08/30/2018. FINDINGS: There is suboptimal inspiration, especially on the lateral view. There is chronic elevation of the right hemidiaphragm with mild basilar atelectasis. Compared with the prior studies, the overall basilar aeration has improved. There is no confluent airspace opacity, edema or pleural effusion. No acute osseous findings are evident. Telemetry leads overlie the chest. IMPRESSION: Chronic bibasilar atelectasis. No evidence of pneumonia or other acute process. Electronically Signed   By: Richardean Sale M.D.   On: 10/13/2018 20:53    Procedures Procedures (including critical care time)  Medications Ordered  in ED Medications  sodium chloride 0.9 % bolus 1,000 mL (0 mLs Intravenous Stopped 10/13/18 2058)    Followed by  0.9 %  sodium chloride infusion (1,000 mLs Intravenous New Bag/Given 10/13/18 2105)  cefTRIAXone (ROCEPHIN) 1 g in sodium chloride 0.9 % 100 mL IVPB (1 g Intravenous New Bag/Given 10/13/18 2106)     Initial Impression / Assessment and Plan / ED Course  I have reviewed the triage vital signs and the nursing notes.  Pertinent labs & imaging results that were available during my care of the patient were reviewed by me and considered in my medical decision making (see chart for details).  Clinical Course as  of Oct 14 2143  Fri Oct 13, 2018  2137 Patient's laboratory tests are notable for an elevated white blood cell count of 54,000   [JK]  2138 1 month ago it was 58,000.  Previous records reviewed the patient has a history of leukemia.  His caregivers were contacted and the patient was scheduled to see his oncologist.  Patient's elevated white blood cell count today does not appear to be indicative of acute infection   [JK]    Clinical Course User Index [JK] Dorie Rank, MD    Patient presented to the emergency room for evaluation of possible pneumonia.  His ED work-up is reassuring with exception of his white blood cell count.  No evidence of pneumonia.  Flu test is negative.  No signs of urinary tract infection.  Lactic acid is normal.  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  Pt will need follow up with his oncologist.  Final Clinical Impressions(s) / ED Diagnoses   Final diagnoses:  Upper respiratory tract infection, unspecified type  Leukocytosis, unspecified type    ED Discharge Orders         Ordered    benzonatate (TESSALON) 100 MG capsule  Every 8 hours     10/13/18 2133    doxycycline (VIBRAMYCIN) 100 MG capsule  2 times daily     10/13/18 2133           Dorie Rank, MD 10/13/18 2144

## 2018-10-13 NOTE — ED Notes (Signed)
Report Called to [facility], Rodney Burton. [facility] called for transport back to group home

## 2018-10-13 NOTE — ED Triage Notes (Signed)
Pt sent by group home due to dry cough and possible PNA. Pt finished antibiotic last night. Pt started running a fever of 101.7 today and down to 99 after 500 mg tylenol

## 2018-10-14 ENCOUNTER — Other Ambulatory Visit: Payer: Self-pay | Admitting: Oncology

## 2018-10-14 DIAGNOSIS — R279 Unspecified lack of coordination: Secondary | ICD-10-CM | POA: Diagnosis not present

## 2018-10-14 DIAGNOSIS — J069 Acute upper respiratory infection, unspecified: Secondary | ICD-10-CM | POA: Diagnosis not present

## 2018-10-14 DIAGNOSIS — R5381 Other malaise: Secondary | ICD-10-CM | POA: Diagnosis not present

## 2018-10-14 DIAGNOSIS — Z743 Need for continuous supervision: Secondary | ICD-10-CM | POA: Diagnosis not present

## 2018-10-14 DIAGNOSIS — R0902 Hypoxemia: Secondary | ICD-10-CM | POA: Diagnosis not present

## 2018-10-27 DIAGNOSIS — F418 Other specified anxiety disorders: Secondary | ICD-10-CM | POA: Diagnosis not present

## 2018-10-27 DIAGNOSIS — M109 Gout, unspecified: Secondary | ICD-10-CM | POA: Diagnosis not present

## 2018-10-27 DIAGNOSIS — Z9119 Patient's noncompliance with other medical treatment and regimen: Secondary | ICD-10-CM | POA: Diagnosis not present

## 2018-10-27 DIAGNOSIS — G309 Alzheimer's disease, unspecified: Secondary | ICD-10-CM | POA: Diagnosis not present

## 2018-10-27 DIAGNOSIS — R197 Diarrhea, unspecified: Secondary | ICD-10-CM | POA: Diagnosis not present

## 2018-10-27 DIAGNOSIS — E7800 Pure hypercholesterolemia, unspecified: Secondary | ICD-10-CM | POA: Diagnosis not present

## 2018-10-27 DIAGNOSIS — B356 Tinea cruris: Secondary | ICD-10-CM | POA: Diagnosis not present

## 2018-10-27 DIAGNOSIS — K5909 Other constipation: Secondary | ICD-10-CM | POA: Diagnosis not present

## 2018-10-27 DIAGNOSIS — Z87440 Personal history of urinary (tract) infections: Secondary | ICD-10-CM | POA: Diagnosis not present

## 2018-10-27 DIAGNOSIS — R590 Localized enlarged lymph nodes: Secondary | ICD-10-CM | POA: Diagnosis not present

## 2018-10-27 DIAGNOSIS — R32 Unspecified urinary incontinence: Secondary | ICD-10-CM | POA: Diagnosis not present

## 2018-10-27 DIAGNOSIS — E119 Type 2 diabetes mellitus without complications: Secondary | ICD-10-CM | POA: Diagnosis not present

## 2018-10-27 DIAGNOSIS — D649 Anemia, unspecified: Secondary | ICD-10-CM | POA: Diagnosis not present

## 2018-10-27 DIAGNOSIS — I10 Essential (primary) hypertension: Secondary | ICD-10-CM | POA: Diagnosis not present

## 2018-10-27 DIAGNOSIS — R160 Hepatomegaly, not elsewhere classified: Secondary | ICD-10-CM | POA: Diagnosis not present

## 2018-10-27 DIAGNOSIS — M545 Low back pain: Secondary | ICD-10-CM | POA: Diagnosis not present

## 2018-10-27 DIAGNOSIS — E559 Vitamin D deficiency, unspecified: Secondary | ICD-10-CM | POA: Diagnosis not present

## 2018-10-27 DIAGNOSIS — C9110 Chronic lymphocytic leukemia of B-cell type not having achieved remission: Secondary | ICD-10-CM | POA: Diagnosis not present

## 2018-10-31 DIAGNOSIS — K5909 Other constipation: Secondary | ICD-10-CM | POA: Diagnosis not present

## 2018-10-31 DIAGNOSIS — M545 Low back pain: Secondary | ICD-10-CM | POA: Diagnosis not present

## 2018-10-31 DIAGNOSIS — R590 Localized enlarged lymph nodes: Secondary | ICD-10-CM | POA: Diagnosis not present

## 2018-10-31 DIAGNOSIS — C9110 Chronic lymphocytic leukemia of B-cell type not having achieved remission: Secondary | ICD-10-CM | POA: Diagnosis not present

## 2018-10-31 DIAGNOSIS — G309 Alzheimer's disease, unspecified: Secondary | ICD-10-CM | POA: Diagnosis not present

## 2018-10-31 DIAGNOSIS — R32 Unspecified urinary incontinence: Secondary | ICD-10-CM | POA: Diagnosis not present

## 2018-11-01 DIAGNOSIS — K5909 Other constipation: Secondary | ICD-10-CM | POA: Diagnosis not present

## 2018-11-01 DIAGNOSIS — Z23 Encounter for immunization: Secondary | ICD-10-CM | POA: Diagnosis not present

## 2018-11-01 DIAGNOSIS — C9110 Chronic lymphocytic leukemia of B-cell type not having achieved remission: Secondary | ICD-10-CM | POA: Diagnosis not present

## 2018-11-01 DIAGNOSIS — Z9181 History of falling: Secondary | ICD-10-CM | POA: Diagnosis not present

## 2018-11-01 DIAGNOSIS — G309 Alzheimer's disease, unspecified: Secondary | ICD-10-CM | POA: Diagnosis not present

## 2018-11-01 DIAGNOSIS — R32 Unspecified urinary incontinence: Secondary | ICD-10-CM | POA: Diagnosis not present

## 2018-11-01 DIAGNOSIS — R5381 Other malaise: Secondary | ICD-10-CM | POA: Diagnosis not present

## 2018-11-01 DIAGNOSIS — Z743 Need for continuous supervision: Secondary | ICD-10-CM | POA: Diagnosis not present

## 2018-11-01 DIAGNOSIS — M545 Low back pain: Secondary | ICD-10-CM | POA: Diagnosis not present

## 2018-11-01 DIAGNOSIS — R590 Localized enlarged lymph nodes: Secondary | ICD-10-CM | POA: Diagnosis not present

## 2018-11-02 DIAGNOSIS — R590 Localized enlarged lymph nodes: Secondary | ICD-10-CM | POA: Diagnosis not present

## 2018-11-02 DIAGNOSIS — R32 Unspecified urinary incontinence: Secondary | ICD-10-CM | POA: Diagnosis not present

## 2018-11-02 DIAGNOSIS — C9110 Chronic lymphocytic leukemia of B-cell type not having achieved remission: Secondary | ICD-10-CM | POA: Diagnosis not present

## 2018-11-02 DIAGNOSIS — G309 Alzheimer's disease, unspecified: Secondary | ICD-10-CM | POA: Diagnosis not present

## 2018-11-02 DIAGNOSIS — M545 Low back pain: Secondary | ICD-10-CM | POA: Diagnosis not present

## 2018-11-02 DIAGNOSIS — K5909 Other constipation: Secondary | ICD-10-CM | POA: Diagnosis not present

## 2018-11-03 DIAGNOSIS — R590 Localized enlarged lymph nodes: Secondary | ICD-10-CM | POA: Diagnosis not present

## 2018-11-03 DIAGNOSIS — M545 Low back pain: Secondary | ICD-10-CM | POA: Diagnosis not present

## 2018-11-03 DIAGNOSIS — R32 Unspecified urinary incontinence: Secondary | ICD-10-CM | POA: Diagnosis not present

## 2018-11-03 DIAGNOSIS — G309 Alzheimer's disease, unspecified: Secondary | ICD-10-CM | POA: Diagnosis not present

## 2018-11-03 DIAGNOSIS — K5909 Other constipation: Secondary | ICD-10-CM | POA: Diagnosis not present

## 2018-11-03 DIAGNOSIS — C9110 Chronic lymphocytic leukemia of B-cell type not having achieved remission: Secondary | ICD-10-CM | POA: Diagnosis not present

## 2018-11-06 DIAGNOSIS — G309 Alzheimer's disease, unspecified: Secondary | ICD-10-CM | POA: Diagnosis not present

## 2018-11-06 DIAGNOSIS — C9110 Chronic lymphocytic leukemia of B-cell type not having achieved remission: Secondary | ICD-10-CM | POA: Diagnosis not present

## 2018-11-06 DIAGNOSIS — R32 Unspecified urinary incontinence: Secondary | ICD-10-CM | POA: Diagnosis not present

## 2018-11-06 DIAGNOSIS — K5909 Other constipation: Secondary | ICD-10-CM | POA: Diagnosis not present

## 2018-11-06 DIAGNOSIS — R590 Localized enlarged lymph nodes: Secondary | ICD-10-CM | POA: Diagnosis not present

## 2018-11-06 DIAGNOSIS — M545 Low back pain: Secondary | ICD-10-CM | POA: Diagnosis not present

## 2018-11-08 DIAGNOSIS — G309 Alzheimer's disease, unspecified: Secondary | ICD-10-CM | POA: Diagnosis not present

## 2018-11-08 DIAGNOSIS — R590 Localized enlarged lymph nodes: Secondary | ICD-10-CM | POA: Diagnosis not present

## 2018-11-08 DIAGNOSIS — R32 Unspecified urinary incontinence: Secondary | ICD-10-CM | POA: Diagnosis not present

## 2018-11-08 DIAGNOSIS — K5909 Other constipation: Secondary | ICD-10-CM | POA: Diagnosis not present

## 2018-11-08 DIAGNOSIS — M545 Low back pain: Secondary | ICD-10-CM | POA: Diagnosis not present

## 2018-11-08 DIAGNOSIS — C9110 Chronic lymphocytic leukemia of B-cell type not having achieved remission: Secondary | ICD-10-CM | POA: Diagnosis not present

## 2018-11-10 DIAGNOSIS — R32 Unspecified urinary incontinence: Secondary | ICD-10-CM | POA: Diagnosis not present

## 2018-11-10 DIAGNOSIS — R590 Localized enlarged lymph nodes: Secondary | ICD-10-CM | POA: Diagnosis not present

## 2018-11-10 DIAGNOSIS — K5909 Other constipation: Secondary | ICD-10-CM | POA: Diagnosis not present

## 2018-11-10 DIAGNOSIS — M545 Low back pain: Secondary | ICD-10-CM | POA: Diagnosis not present

## 2018-11-10 DIAGNOSIS — C9110 Chronic lymphocytic leukemia of B-cell type not having achieved remission: Secondary | ICD-10-CM | POA: Diagnosis not present

## 2018-11-10 DIAGNOSIS — G309 Alzheimer's disease, unspecified: Secondary | ICD-10-CM | POA: Diagnosis not present

## 2018-11-13 DIAGNOSIS — R590 Localized enlarged lymph nodes: Secondary | ICD-10-CM | POA: Diagnosis not present

## 2018-11-13 DIAGNOSIS — M545 Low back pain: Secondary | ICD-10-CM | POA: Diagnosis not present

## 2018-11-13 DIAGNOSIS — K5909 Other constipation: Secondary | ICD-10-CM | POA: Diagnosis not present

## 2018-11-13 DIAGNOSIS — R32 Unspecified urinary incontinence: Secondary | ICD-10-CM | POA: Diagnosis not present

## 2018-11-13 DIAGNOSIS — C9110 Chronic lymphocytic leukemia of B-cell type not having achieved remission: Secondary | ICD-10-CM | POA: Diagnosis not present

## 2018-11-13 DIAGNOSIS — G309 Alzheimer's disease, unspecified: Secondary | ICD-10-CM | POA: Diagnosis not present

## 2018-11-16 DIAGNOSIS — R32 Unspecified urinary incontinence: Secondary | ICD-10-CM | POA: Diagnosis not present

## 2018-11-16 DIAGNOSIS — R590 Localized enlarged lymph nodes: Secondary | ICD-10-CM | POA: Diagnosis not present

## 2018-11-16 DIAGNOSIS — C9110 Chronic lymphocytic leukemia of B-cell type not having achieved remission: Secondary | ICD-10-CM | POA: Diagnosis not present

## 2018-11-16 DIAGNOSIS — M545 Low back pain: Secondary | ICD-10-CM | POA: Diagnosis not present

## 2018-11-16 DIAGNOSIS — G309 Alzheimer's disease, unspecified: Secondary | ICD-10-CM | POA: Diagnosis not present

## 2018-11-16 DIAGNOSIS — K5909 Other constipation: Secondary | ICD-10-CM | POA: Diagnosis not present

## 2018-11-17 DIAGNOSIS — G309 Alzheimer's disease, unspecified: Secondary | ICD-10-CM | POA: Diagnosis not present

## 2018-11-17 DIAGNOSIS — R590 Localized enlarged lymph nodes: Secondary | ICD-10-CM | POA: Diagnosis not present

## 2018-11-17 DIAGNOSIS — C9110 Chronic lymphocytic leukemia of B-cell type not having achieved remission: Secondary | ICD-10-CM | POA: Diagnosis not present

## 2018-11-17 DIAGNOSIS — K5909 Other constipation: Secondary | ICD-10-CM | POA: Diagnosis not present

## 2018-11-17 DIAGNOSIS — M545 Low back pain: Secondary | ICD-10-CM | POA: Diagnosis not present

## 2018-11-17 DIAGNOSIS — R32 Unspecified urinary incontinence: Secondary | ICD-10-CM | POA: Diagnosis not present

## 2018-11-20 DIAGNOSIS — C9110 Chronic lymphocytic leukemia of B-cell type not having achieved remission: Secondary | ICD-10-CM | POA: Diagnosis not present

## 2018-11-20 DIAGNOSIS — R590 Localized enlarged lymph nodes: Secondary | ICD-10-CM | POA: Diagnosis not present

## 2018-11-20 DIAGNOSIS — K5909 Other constipation: Secondary | ICD-10-CM | POA: Diagnosis not present

## 2018-11-20 DIAGNOSIS — M545 Low back pain: Secondary | ICD-10-CM | POA: Diagnosis not present

## 2018-11-20 DIAGNOSIS — G309 Alzheimer's disease, unspecified: Secondary | ICD-10-CM | POA: Diagnosis not present

## 2018-11-20 DIAGNOSIS — R32 Unspecified urinary incontinence: Secondary | ICD-10-CM | POA: Diagnosis not present

## 2018-11-21 DIAGNOSIS — M545 Low back pain: Secondary | ICD-10-CM | POA: Diagnosis not present

## 2018-11-21 DIAGNOSIS — K5909 Other constipation: Secondary | ICD-10-CM | POA: Diagnosis not present

## 2018-11-21 DIAGNOSIS — C9110 Chronic lymphocytic leukemia of B-cell type not having achieved remission: Secondary | ICD-10-CM | POA: Diagnosis not present

## 2018-11-21 DIAGNOSIS — R590 Localized enlarged lymph nodes: Secondary | ICD-10-CM | POA: Diagnosis not present

## 2018-11-21 DIAGNOSIS — R32 Unspecified urinary incontinence: Secondary | ICD-10-CM | POA: Diagnosis not present

## 2018-11-21 DIAGNOSIS — G309 Alzheimer's disease, unspecified: Secondary | ICD-10-CM | POA: Diagnosis not present

## 2018-11-22 DIAGNOSIS — I10 Essential (primary) hypertension: Secondary | ICD-10-CM | POA: Diagnosis not present

## 2018-11-22 DIAGNOSIS — B356 Tinea cruris: Secondary | ICD-10-CM | POA: Diagnosis not present

## 2018-11-22 DIAGNOSIS — M109 Gout, unspecified: Secondary | ICD-10-CM | POA: Diagnosis not present

## 2018-11-22 DIAGNOSIS — C9110 Chronic lymphocytic leukemia of B-cell type not having achieved remission: Secondary | ICD-10-CM | POA: Diagnosis not present

## 2018-11-22 DIAGNOSIS — K5909 Other constipation: Secondary | ICD-10-CM | POA: Diagnosis not present

## 2018-11-22 DIAGNOSIS — R32 Unspecified urinary incontinence: Secondary | ICD-10-CM | POA: Diagnosis not present

## 2018-11-22 DIAGNOSIS — E119 Type 2 diabetes mellitus without complications: Secondary | ICD-10-CM | POA: Diagnosis not present

## 2018-11-22 DIAGNOSIS — M545 Low back pain: Secondary | ICD-10-CM | POA: Diagnosis not present

## 2018-11-22 DIAGNOSIS — R197 Diarrhea, unspecified: Secondary | ICD-10-CM | POA: Diagnosis not present

## 2018-11-22 DIAGNOSIS — G309 Alzheimer's disease, unspecified: Secondary | ICD-10-CM | POA: Diagnosis not present

## 2018-11-22 DIAGNOSIS — Z87440 Personal history of urinary (tract) infections: Secondary | ICD-10-CM | POA: Diagnosis not present

## 2018-11-22 DIAGNOSIS — R590 Localized enlarged lymph nodes: Secondary | ICD-10-CM | POA: Diagnosis not present

## 2018-11-22 DIAGNOSIS — F418 Other specified anxiety disorders: Secondary | ICD-10-CM | POA: Diagnosis not present

## 2018-11-22 DIAGNOSIS — E7800 Pure hypercholesterolemia, unspecified: Secondary | ICD-10-CM | POA: Diagnosis not present

## 2018-11-22 DIAGNOSIS — E559 Vitamin D deficiency, unspecified: Secondary | ICD-10-CM | POA: Diagnosis not present

## 2018-11-22 DIAGNOSIS — R160 Hepatomegaly, not elsewhere classified: Secondary | ICD-10-CM | POA: Diagnosis not present

## 2018-11-22 DIAGNOSIS — Z9119 Patient's noncompliance with other medical treatment and regimen: Secondary | ICD-10-CM | POA: Diagnosis not present

## 2018-11-22 DIAGNOSIS — D649 Anemia, unspecified: Secondary | ICD-10-CM | POA: Diagnosis not present

## 2018-11-23 DIAGNOSIS — G309 Alzheimer's disease, unspecified: Secondary | ICD-10-CM | POA: Diagnosis not present

## 2018-11-23 DIAGNOSIS — R590 Localized enlarged lymph nodes: Secondary | ICD-10-CM | POA: Diagnosis not present

## 2018-11-23 DIAGNOSIS — M545 Low back pain: Secondary | ICD-10-CM | POA: Diagnosis not present

## 2018-11-23 DIAGNOSIS — R32 Unspecified urinary incontinence: Secondary | ICD-10-CM | POA: Diagnosis not present

## 2018-11-23 DIAGNOSIS — K5909 Other constipation: Secondary | ICD-10-CM | POA: Diagnosis not present

## 2018-11-23 DIAGNOSIS — C9110 Chronic lymphocytic leukemia of B-cell type not having achieved remission: Secondary | ICD-10-CM | POA: Diagnosis not present

## 2018-11-24 ENCOUNTER — Ambulatory Visit: Payer: Medicare Other | Admitting: Sports Medicine

## 2018-11-27 DIAGNOSIS — M545 Low back pain: Secondary | ICD-10-CM | POA: Diagnosis not present

## 2018-11-27 DIAGNOSIS — K5909 Other constipation: Secondary | ICD-10-CM | POA: Diagnosis not present

## 2018-11-27 DIAGNOSIS — G309 Alzheimer's disease, unspecified: Secondary | ICD-10-CM | POA: Diagnosis not present

## 2018-11-27 DIAGNOSIS — C9110 Chronic lymphocytic leukemia of B-cell type not having achieved remission: Secondary | ICD-10-CM | POA: Diagnosis not present

## 2018-11-27 DIAGNOSIS — R590 Localized enlarged lymph nodes: Secondary | ICD-10-CM | POA: Diagnosis not present

## 2018-11-27 DIAGNOSIS — R32 Unspecified urinary incontinence: Secondary | ICD-10-CM | POA: Diagnosis not present

## 2018-11-28 DIAGNOSIS — G309 Alzheimer's disease, unspecified: Secondary | ICD-10-CM | POA: Diagnosis not present

## 2018-11-28 DIAGNOSIS — R590 Localized enlarged lymph nodes: Secondary | ICD-10-CM | POA: Diagnosis not present

## 2018-11-28 DIAGNOSIS — C9110 Chronic lymphocytic leukemia of B-cell type not having achieved remission: Secondary | ICD-10-CM | POA: Diagnosis not present

## 2018-11-28 DIAGNOSIS — M545 Low back pain: Secondary | ICD-10-CM | POA: Diagnosis not present

## 2018-11-28 DIAGNOSIS — K5909 Other constipation: Secondary | ICD-10-CM | POA: Diagnosis not present

## 2018-11-28 DIAGNOSIS — R32 Unspecified urinary incontinence: Secondary | ICD-10-CM | POA: Diagnosis not present

## 2018-11-29 DIAGNOSIS — K5909 Other constipation: Secondary | ICD-10-CM | POA: Diagnosis not present

## 2018-11-29 DIAGNOSIS — C9110 Chronic lymphocytic leukemia of B-cell type not having achieved remission: Secondary | ICD-10-CM | POA: Diagnosis not present

## 2018-11-29 DIAGNOSIS — G309 Alzheimer's disease, unspecified: Secondary | ICD-10-CM | POA: Diagnosis not present

## 2018-11-29 DIAGNOSIS — M545 Low back pain: Secondary | ICD-10-CM | POA: Diagnosis not present

## 2018-11-29 DIAGNOSIS — R32 Unspecified urinary incontinence: Secondary | ICD-10-CM | POA: Diagnosis not present

## 2018-11-29 DIAGNOSIS — R590 Localized enlarged lymph nodes: Secondary | ICD-10-CM | POA: Diagnosis not present

## 2018-12-01 DIAGNOSIS — C9110 Chronic lymphocytic leukemia of B-cell type not having achieved remission: Secondary | ICD-10-CM | POA: Diagnosis not present

## 2018-12-01 DIAGNOSIS — R590 Localized enlarged lymph nodes: Secondary | ICD-10-CM | POA: Diagnosis not present

## 2018-12-01 DIAGNOSIS — G309 Alzheimer's disease, unspecified: Secondary | ICD-10-CM | POA: Diagnosis not present

## 2018-12-01 DIAGNOSIS — M545 Low back pain: Secondary | ICD-10-CM | POA: Diagnosis not present

## 2018-12-01 DIAGNOSIS — R32 Unspecified urinary incontinence: Secondary | ICD-10-CM | POA: Diagnosis not present

## 2018-12-01 DIAGNOSIS — K5909 Other constipation: Secondary | ICD-10-CM | POA: Diagnosis not present

## 2018-12-04 DIAGNOSIS — M545 Low back pain: Secondary | ICD-10-CM | POA: Diagnosis not present

## 2018-12-04 DIAGNOSIS — K5909 Other constipation: Secondary | ICD-10-CM | POA: Diagnosis not present

## 2018-12-04 DIAGNOSIS — R590 Localized enlarged lymph nodes: Secondary | ICD-10-CM | POA: Diagnosis not present

## 2018-12-04 DIAGNOSIS — C9110 Chronic lymphocytic leukemia of B-cell type not having achieved remission: Secondary | ICD-10-CM | POA: Diagnosis not present

## 2018-12-04 DIAGNOSIS — R32 Unspecified urinary incontinence: Secondary | ICD-10-CM | POA: Diagnosis not present

## 2018-12-04 DIAGNOSIS — G309 Alzheimer's disease, unspecified: Secondary | ICD-10-CM | POA: Diagnosis not present

## 2018-12-06 DIAGNOSIS — C9110 Chronic lymphocytic leukemia of B-cell type not having achieved remission: Secondary | ICD-10-CM | POA: Diagnosis not present

## 2018-12-06 DIAGNOSIS — G309 Alzheimer's disease, unspecified: Secondary | ICD-10-CM | POA: Diagnosis not present

## 2018-12-06 DIAGNOSIS — R32 Unspecified urinary incontinence: Secondary | ICD-10-CM | POA: Diagnosis not present

## 2018-12-06 DIAGNOSIS — M545 Low back pain: Secondary | ICD-10-CM | POA: Diagnosis not present

## 2018-12-06 DIAGNOSIS — R590 Localized enlarged lymph nodes: Secondary | ICD-10-CM | POA: Diagnosis not present

## 2018-12-06 DIAGNOSIS — K5909 Other constipation: Secondary | ICD-10-CM | POA: Diagnosis not present

## 2018-12-07 DIAGNOSIS — C9110 Chronic lymphocytic leukemia of B-cell type not having achieved remission: Secondary | ICD-10-CM | POA: Diagnosis not present

## 2018-12-07 DIAGNOSIS — R590 Localized enlarged lymph nodes: Secondary | ICD-10-CM | POA: Diagnosis not present

## 2018-12-07 DIAGNOSIS — M545 Low back pain: Secondary | ICD-10-CM | POA: Diagnosis not present

## 2018-12-07 DIAGNOSIS — G309 Alzheimer's disease, unspecified: Secondary | ICD-10-CM | POA: Diagnosis not present

## 2018-12-07 DIAGNOSIS — K5909 Other constipation: Secondary | ICD-10-CM | POA: Diagnosis not present

## 2018-12-07 DIAGNOSIS — R32 Unspecified urinary incontinence: Secondary | ICD-10-CM | POA: Diagnosis not present

## 2018-12-08 DIAGNOSIS — C9110 Chronic lymphocytic leukemia of B-cell type not having achieved remission: Secondary | ICD-10-CM | POA: Diagnosis not present

## 2018-12-08 DIAGNOSIS — K5909 Other constipation: Secondary | ICD-10-CM | POA: Diagnosis not present

## 2018-12-08 DIAGNOSIS — M545 Low back pain: Secondary | ICD-10-CM | POA: Diagnosis not present

## 2018-12-08 DIAGNOSIS — R590 Localized enlarged lymph nodes: Secondary | ICD-10-CM | POA: Diagnosis not present

## 2018-12-08 DIAGNOSIS — R32 Unspecified urinary incontinence: Secondary | ICD-10-CM | POA: Diagnosis not present

## 2018-12-08 DIAGNOSIS — G309 Alzheimer's disease, unspecified: Secondary | ICD-10-CM | POA: Diagnosis not present

## 2018-12-11 DIAGNOSIS — G309 Alzheimer's disease, unspecified: Secondary | ICD-10-CM | POA: Diagnosis not present

## 2018-12-11 DIAGNOSIS — R590 Localized enlarged lymph nodes: Secondary | ICD-10-CM | POA: Diagnosis not present

## 2018-12-11 DIAGNOSIS — C9110 Chronic lymphocytic leukemia of B-cell type not having achieved remission: Secondary | ICD-10-CM | POA: Diagnosis not present

## 2018-12-11 DIAGNOSIS — M545 Low back pain: Secondary | ICD-10-CM | POA: Diagnosis not present

## 2018-12-11 DIAGNOSIS — K5909 Other constipation: Secondary | ICD-10-CM | POA: Diagnosis not present

## 2018-12-11 DIAGNOSIS — R32 Unspecified urinary incontinence: Secondary | ICD-10-CM | POA: Diagnosis not present

## 2018-12-12 DIAGNOSIS — G309 Alzheimer's disease, unspecified: Secondary | ICD-10-CM | POA: Diagnosis not present

## 2018-12-12 DIAGNOSIS — R32 Unspecified urinary incontinence: Secondary | ICD-10-CM | POA: Diagnosis not present

## 2018-12-12 DIAGNOSIS — R590 Localized enlarged lymph nodes: Secondary | ICD-10-CM | POA: Diagnosis not present

## 2018-12-12 DIAGNOSIS — C9110 Chronic lymphocytic leukemia of B-cell type not having achieved remission: Secondary | ICD-10-CM | POA: Diagnosis not present

## 2018-12-12 DIAGNOSIS — K5909 Other constipation: Secondary | ICD-10-CM | POA: Diagnosis not present

## 2018-12-12 DIAGNOSIS — M545 Low back pain: Secondary | ICD-10-CM | POA: Diagnosis not present

## 2018-12-13 DIAGNOSIS — C9110 Chronic lymphocytic leukemia of B-cell type not having achieved remission: Secondary | ICD-10-CM | POA: Diagnosis not present

## 2018-12-13 DIAGNOSIS — R32 Unspecified urinary incontinence: Secondary | ICD-10-CM | POA: Diagnosis not present

## 2018-12-13 DIAGNOSIS — M545 Low back pain: Secondary | ICD-10-CM | POA: Diagnosis not present

## 2018-12-13 DIAGNOSIS — K5909 Other constipation: Secondary | ICD-10-CM | POA: Diagnosis not present

## 2018-12-13 DIAGNOSIS — R590 Localized enlarged lymph nodes: Secondary | ICD-10-CM | POA: Diagnosis not present

## 2018-12-13 DIAGNOSIS — G309 Alzheimer's disease, unspecified: Secondary | ICD-10-CM | POA: Diagnosis not present

## 2018-12-15 DIAGNOSIS — K5909 Other constipation: Secondary | ICD-10-CM | POA: Diagnosis not present

## 2018-12-15 DIAGNOSIS — M545 Low back pain: Secondary | ICD-10-CM | POA: Diagnosis not present

## 2018-12-15 DIAGNOSIS — C9110 Chronic lymphocytic leukemia of B-cell type not having achieved remission: Secondary | ICD-10-CM | POA: Diagnosis not present

## 2018-12-15 DIAGNOSIS — R32 Unspecified urinary incontinence: Secondary | ICD-10-CM | POA: Diagnosis not present

## 2018-12-15 DIAGNOSIS — G309 Alzheimer's disease, unspecified: Secondary | ICD-10-CM | POA: Diagnosis not present

## 2018-12-15 DIAGNOSIS — R590 Localized enlarged lymph nodes: Secondary | ICD-10-CM | POA: Diagnosis not present

## 2018-12-18 DIAGNOSIS — C9110 Chronic lymphocytic leukemia of B-cell type not having achieved remission: Secondary | ICD-10-CM | POA: Diagnosis not present

## 2018-12-18 DIAGNOSIS — R590 Localized enlarged lymph nodes: Secondary | ICD-10-CM | POA: Diagnosis not present

## 2018-12-18 DIAGNOSIS — R32 Unspecified urinary incontinence: Secondary | ICD-10-CM | POA: Diagnosis not present

## 2018-12-18 DIAGNOSIS — M545 Low back pain: Secondary | ICD-10-CM | POA: Diagnosis not present

## 2018-12-18 DIAGNOSIS — K5909 Other constipation: Secondary | ICD-10-CM | POA: Diagnosis not present

## 2018-12-18 DIAGNOSIS — G309 Alzheimer's disease, unspecified: Secondary | ICD-10-CM | POA: Diagnosis not present

## 2018-12-20 DIAGNOSIS — G309 Alzheimer's disease, unspecified: Secondary | ICD-10-CM | POA: Diagnosis not present

## 2018-12-20 DIAGNOSIS — R32 Unspecified urinary incontinence: Secondary | ICD-10-CM | POA: Diagnosis not present

## 2018-12-20 DIAGNOSIS — M545 Low back pain: Secondary | ICD-10-CM | POA: Diagnosis not present

## 2018-12-20 DIAGNOSIS — K5909 Other constipation: Secondary | ICD-10-CM | POA: Diagnosis not present

## 2018-12-20 DIAGNOSIS — C9110 Chronic lymphocytic leukemia of B-cell type not having achieved remission: Secondary | ICD-10-CM | POA: Diagnosis not present

## 2018-12-20 DIAGNOSIS — R590 Localized enlarged lymph nodes: Secondary | ICD-10-CM | POA: Diagnosis not present

## 2018-12-22 DIAGNOSIS — G309 Alzheimer's disease, unspecified: Secondary | ICD-10-CM | POA: Diagnosis not present

## 2018-12-22 DIAGNOSIS — R590 Localized enlarged lymph nodes: Secondary | ICD-10-CM | POA: Diagnosis not present

## 2018-12-22 DIAGNOSIS — M545 Low back pain: Secondary | ICD-10-CM | POA: Diagnosis not present

## 2018-12-22 DIAGNOSIS — R32 Unspecified urinary incontinence: Secondary | ICD-10-CM | POA: Diagnosis not present

## 2018-12-22 DIAGNOSIS — C9110 Chronic lymphocytic leukemia of B-cell type not having achieved remission: Secondary | ICD-10-CM | POA: Diagnosis not present

## 2018-12-22 DIAGNOSIS — K5909 Other constipation: Secondary | ICD-10-CM | POA: Diagnosis not present

## 2018-12-23 DIAGNOSIS — R160 Hepatomegaly, not elsewhere classified: Secondary | ICD-10-CM | POA: Diagnosis not present

## 2018-12-23 DIAGNOSIS — R32 Unspecified urinary incontinence: Secondary | ICD-10-CM | POA: Diagnosis not present

## 2018-12-23 DIAGNOSIS — Z87440 Personal history of urinary (tract) infections: Secondary | ICD-10-CM | POA: Diagnosis not present

## 2018-12-23 DIAGNOSIS — M545 Low back pain: Secondary | ICD-10-CM | POA: Diagnosis not present

## 2018-12-23 DIAGNOSIS — K5909 Other constipation: Secondary | ICD-10-CM | POA: Diagnosis not present

## 2018-12-23 DIAGNOSIS — E119 Type 2 diabetes mellitus without complications: Secondary | ICD-10-CM | POA: Diagnosis not present

## 2018-12-23 DIAGNOSIS — I10 Essential (primary) hypertension: Secondary | ICD-10-CM | POA: Diagnosis not present

## 2018-12-23 DIAGNOSIS — E7800 Pure hypercholesterolemia, unspecified: Secondary | ICD-10-CM | POA: Diagnosis not present

## 2018-12-23 DIAGNOSIS — D649 Anemia, unspecified: Secondary | ICD-10-CM | POA: Diagnosis not present

## 2018-12-23 DIAGNOSIS — E559 Vitamin D deficiency, unspecified: Secondary | ICD-10-CM | POA: Diagnosis not present

## 2018-12-23 DIAGNOSIS — F418 Other specified anxiety disorders: Secondary | ICD-10-CM | POA: Diagnosis not present

## 2018-12-23 DIAGNOSIS — C9110 Chronic lymphocytic leukemia of B-cell type not having achieved remission: Secondary | ICD-10-CM | POA: Diagnosis not present

## 2018-12-23 DIAGNOSIS — M109 Gout, unspecified: Secondary | ICD-10-CM | POA: Diagnosis not present

## 2018-12-23 DIAGNOSIS — G309 Alzheimer's disease, unspecified: Secondary | ICD-10-CM | POA: Diagnosis not present

## 2018-12-23 DIAGNOSIS — Z9119 Patient's noncompliance with other medical treatment and regimen: Secondary | ICD-10-CM | POA: Diagnosis not present

## 2018-12-23 DIAGNOSIS — R197 Diarrhea, unspecified: Secondary | ICD-10-CM | POA: Diagnosis not present

## 2018-12-23 DIAGNOSIS — R590 Localized enlarged lymph nodes: Secondary | ICD-10-CM | POA: Diagnosis not present

## 2018-12-23 DIAGNOSIS — B356 Tinea cruris: Secondary | ICD-10-CM | POA: Diagnosis not present

## 2018-12-25 DIAGNOSIS — K5909 Other constipation: Secondary | ICD-10-CM | POA: Diagnosis not present

## 2018-12-25 DIAGNOSIS — R32 Unspecified urinary incontinence: Secondary | ICD-10-CM | POA: Diagnosis not present

## 2018-12-25 DIAGNOSIS — G309 Alzheimer's disease, unspecified: Secondary | ICD-10-CM | POA: Diagnosis not present

## 2018-12-25 DIAGNOSIS — C9110 Chronic lymphocytic leukemia of B-cell type not having achieved remission: Secondary | ICD-10-CM | POA: Diagnosis not present

## 2018-12-25 DIAGNOSIS — M545 Low back pain: Secondary | ICD-10-CM | POA: Diagnosis not present

## 2018-12-25 DIAGNOSIS — R590 Localized enlarged lymph nodes: Secondary | ICD-10-CM | POA: Diagnosis not present

## 2018-12-26 DIAGNOSIS — M545 Low back pain: Secondary | ICD-10-CM | POA: Diagnosis not present

## 2018-12-26 DIAGNOSIS — C9110 Chronic lymphocytic leukemia of B-cell type not having achieved remission: Secondary | ICD-10-CM | POA: Diagnosis not present

## 2018-12-26 DIAGNOSIS — R32 Unspecified urinary incontinence: Secondary | ICD-10-CM | POA: Diagnosis not present

## 2018-12-26 DIAGNOSIS — R590 Localized enlarged lymph nodes: Secondary | ICD-10-CM | POA: Diagnosis not present

## 2018-12-26 DIAGNOSIS — K5909 Other constipation: Secondary | ICD-10-CM | POA: Diagnosis not present

## 2018-12-26 DIAGNOSIS — G309 Alzheimer's disease, unspecified: Secondary | ICD-10-CM | POA: Diagnosis not present

## 2018-12-27 DIAGNOSIS — K5909 Other constipation: Secondary | ICD-10-CM | POA: Diagnosis not present

## 2018-12-27 DIAGNOSIS — M545 Low back pain: Secondary | ICD-10-CM | POA: Diagnosis not present

## 2018-12-27 DIAGNOSIS — R32 Unspecified urinary incontinence: Secondary | ICD-10-CM | POA: Diagnosis not present

## 2018-12-27 DIAGNOSIS — C9110 Chronic lymphocytic leukemia of B-cell type not having achieved remission: Secondary | ICD-10-CM | POA: Diagnosis not present

## 2018-12-27 DIAGNOSIS — R590 Localized enlarged lymph nodes: Secondary | ICD-10-CM | POA: Diagnosis not present

## 2018-12-27 DIAGNOSIS — G309 Alzheimer's disease, unspecified: Secondary | ICD-10-CM | POA: Diagnosis not present

## 2018-12-29 DIAGNOSIS — R32 Unspecified urinary incontinence: Secondary | ICD-10-CM | POA: Diagnosis not present

## 2018-12-29 DIAGNOSIS — C9110 Chronic lymphocytic leukemia of B-cell type not having achieved remission: Secondary | ICD-10-CM | POA: Diagnosis not present

## 2018-12-29 DIAGNOSIS — G309 Alzheimer's disease, unspecified: Secondary | ICD-10-CM | POA: Diagnosis not present

## 2018-12-29 DIAGNOSIS — K5909 Other constipation: Secondary | ICD-10-CM | POA: Diagnosis not present

## 2018-12-29 DIAGNOSIS — M545 Low back pain: Secondary | ICD-10-CM | POA: Diagnosis not present

## 2018-12-29 DIAGNOSIS — R590 Localized enlarged lymph nodes: Secondary | ICD-10-CM | POA: Diagnosis not present

## 2019-01-01 DIAGNOSIS — K5909 Other constipation: Secondary | ICD-10-CM | POA: Diagnosis not present

## 2019-01-01 DIAGNOSIS — R32 Unspecified urinary incontinence: Secondary | ICD-10-CM | POA: Diagnosis not present

## 2019-01-01 DIAGNOSIS — C9110 Chronic lymphocytic leukemia of B-cell type not having achieved remission: Secondary | ICD-10-CM | POA: Diagnosis not present

## 2019-01-01 DIAGNOSIS — M545 Low back pain: Secondary | ICD-10-CM | POA: Diagnosis not present

## 2019-01-01 DIAGNOSIS — R590 Localized enlarged lymph nodes: Secondary | ICD-10-CM | POA: Diagnosis not present

## 2019-01-01 DIAGNOSIS — G309 Alzheimer's disease, unspecified: Secondary | ICD-10-CM | POA: Diagnosis not present

## 2019-01-03 DIAGNOSIS — K5909 Other constipation: Secondary | ICD-10-CM | POA: Diagnosis not present

## 2019-01-03 DIAGNOSIS — R32 Unspecified urinary incontinence: Secondary | ICD-10-CM | POA: Diagnosis not present

## 2019-01-03 DIAGNOSIS — G309 Alzheimer's disease, unspecified: Secondary | ICD-10-CM | POA: Diagnosis not present

## 2019-01-03 DIAGNOSIS — C9110 Chronic lymphocytic leukemia of B-cell type not having achieved remission: Secondary | ICD-10-CM | POA: Diagnosis not present

## 2019-01-03 DIAGNOSIS — M545 Low back pain: Secondary | ICD-10-CM | POA: Diagnosis not present

## 2019-01-03 DIAGNOSIS — R590 Localized enlarged lymph nodes: Secondary | ICD-10-CM | POA: Diagnosis not present

## 2019-01-04 DIAGNOSIS — R32 Unspecified urinary incontinence: Secondary | ICD-10-CM | POA: Diagnosis not present

## 2019-01-04 DIAGNOSIS — R590 Localized enlarged lymph nodes: Secondary | ICD-10-CM | POA: Diagnosis not present

## 2019-01-04 DIAGNOSIS — K5909 Other constipation: Secondary | ICD-10-CM | POA: Diagnosis not present

## 2019-01-04 DIAGNOSIS — G309 Alzheimer's disease, unspecified: Secondary | ICD-10-CM | POA: Diagnosis not present

## 2019-01-04 DIAGNOSIS — C9110 Chronic lymphocytic leukemia of B-cell type not having achieved remission: Secondary | ICD-10-CM | POA: Diagnosis not present

## 2019-01-04 DIAGNOSIS — M545 Low back pain: Secondary | ICD-10-CM | POA: Diagnosis not present

## 2019-01-05 DIAGNOSIS — R32 Unspecified urinary incontinence: Secondary | ICD-10-CM | POA: Diagnosis not present

## 2019-01-05 DIAGNOSIS — K5909 Other constipation: Secondary | ICD-10-CM | POA: Diagnosis not present

## 2019-01-05 DIAGNOSIS — R590 Localized enlarged lymph nodes: Secondary | ICD-10-CM | POA: Diagnosis not present

## 2019-01-05 DIAGNOSIS — C9110 Chronic lymphocytic leukemia of B-cell type not having achieved remission: Secondary | ICD-10-CM | POA: Diagnosis not present

## 2019-01-05 DIAGNOSIS — G309 Alzheimer's disease, unspecified: Secondary | ICD-10-CM | POA: Diagnosis not present

## 2019-01-05 DIAGNOSIS — M545 Low back pain: Secondary | ICD-10-CM | POA: Diagnosis not present

## 2019-01-08 DIAGNOSIS — R590 Localized enlarged lymph nodes: Secondary | ICD-10-CM | POA: Diagnosis not present

## 2019-01-08 DIAGNOSIS — K5909 Other constipation: Secondary | ICD-10-CM | POA: Diagnosis not present

## 2019-01-08 DIAGNOSIS — M545 Low back pain: Secondary | ICD-10-CM | POA: Diagnosis not present

## 2019-01-08 DIAGNOSIS — G309 Alzheimer's disease, unspecified: Secondary | ICD-10-CM | POA: Diagnosis not present

## 2019-01-08 DIAGNOSIS — C9110 Chronic lymphocytic leukemia of B-cell type not having achieved remission: Secondary | ICD-10-CM | POA: Diagnosis not present

## 2019-01-08 DIAGNOSIS — R32 Unspecified urinary incontinence: Secondary | ICD-10-CM | POA: Diagnosis not present

## 2019-01-10 DIAGNOSIS — C9110 Chronic lymphocytic leukemia of B-cell type not having achieved remission: Secondary | ICD-10-CM | POA: Diagnosis not present

## 2019-01-10 DIAGNOSIS — K5909 Other constipation: Secondary | ICD-10-CM | POA: Diagnosis not present

## 2019-01-10 DIAGNOSIS — M545 Low back pain: Secondary | ICD-10-CM | POA: Diagnosis not present

## 2019-01-10 DIAGNOSIS — R32 Unspecified urinary incontinence: Secondary | ICD-10-CM | POA: Diagnosis not present

## 2019-01-10 DIAGNOSIS — G309 Alzheimer's disease, unspecified: Secondary | ICD-10-CM | POA: Diagnosis not present

## 2019-01-10 DIAGNOSIS — R590 Localized enlarged lymph nodes: Secondary | ICD-10-CM | POA: Diagnosis not present

## 2019-01-11 DIAGNOSIS — K5909 Other constipation: Secondary | ICD-10-CM | POA: Diagnosis not present

## 2019-01-11 DIAGNOSIS — C9110 Chronic lymphocytic leukemia of B-cell type not having achieved remission: Secondary | ICD-10-CM | POA: Diagnosis not present

## 2019-01-11 DIAGNOSIS — R32 Unspecified urinary incontinence: Secondary | ICD-10-CM | POA: Diagnosis not present

## 2019-01-11 DIAGNOSIS — R590 Localized enlarged lymph nodes: Secondary | ICD-10-CM | POA: Diagnosis not present

## 2019-01-11 DIAGNOSIS — M545 Low back pain: Secondary | ICD-10-CM | POA: Diagnosis not present

## 2019-01-11 DIAGNOSIS — G309 Alzheimer's disease, unspecified: Secondary | ICD-10-CM | POA: Diagnosis not present

## 2019-01-15 DIAGNOSIS — K5909 Other constipation: Secondary | ICD-10-CM | POA: Diagnosis not present

## 2019-01-15 DIAGNOSIS — R590 Localized enlarged lymph nodes: Secondary | ICD-10-CM | POA: Diagnosis not present

## 2019-01-15 DIAGNOSIS — G309 Alzheimer's disease, unspecified: Secondary | ICD-10-CM | POA: Diagnosis not present

## 2019-01-15 DIAGNOSIS — C9110 Chronic lymphocytic leukemia of B-cell type not having achieved remission: Secondary | ICD-10-CM | POA: Diagnosis not present

## 2019-01-15 DIAGNOSIS — R32 Unspecified urinary incontinence: Secondary | ICD-10-CM | POA: Diagnosis not present

## 2019-01-15 DIAGNOSIS — M545 Low back pain: Secondary | ICD-10-CM | POA: Diagnosis not present

## 2019-01-16 DIAGNOSIS — G309 Alzheimer's disease, unspecified: Secondary | ICD-10-CM | POA: Diagnosis not present

## 2019-01-16 DIAGNOSIS — R32 Unspecified urinary incontinence: Secondary | ICD-10-CM | POA: Diagnosis not present

## 2019-01-16 DIAGNOSIS — K5909 Other constipation: Secondary | ICD-10-CM | POA: Diagnosis not present

## 2019-01-16 DIAGNOSIS — M545 Low back pain: Secondary | ICD-10-CM | POA: Diagnosis not present

## 2019-01-16 DIAGNOSIS — R590 Localized enlarged lymph nodes: Secondary | ICD-10-CM | POA: Diagnosis not present

## 2019-01-16 DIAGNOSIS — C9110 Chronic lymphocytic leukemia of B-cell type not having achieved remission: Secondary | ICD-10-CM | POA: Diagnosis not present

## 2019-01-17 DIAGNOSIS — G309 Alzheimer's disease, unspecified: Secondary | ICD-10-CM | POA: Diagnosis not present

## 2019-01-17 DIAGNOSIS — K5909 Other constipation: Secondary | ICD-10-CM | POA: Diagnosis not present

## 2019-01-17 DIAGNOSIS — M545 Low back pain: Secondary | ICD-10-CM | POA: Diagnosis not present

## 2019-01-17 DIAGNOSIS — R32 Unspecified urinary incontinence: Secondary | ICD-10-CM | POA: Diagnosis not present

## 2019-01-17 DIAGNOSIS — C9110 Chronic lymphocytic leukemia of B-cell type not having achieved remission: Secondary | ICD-10-CM | POA: Diagnosis not present

## 2019-01-17 DIAGNOSIS — R590 Localized enlarged lymph nodes: Secondary | ICD-10-CM | POA: Diagnosis not present

## 2019-01-19 DIAGNOSIS — M545 Low back pain: Secondary | ICD-10-CM | POA: Diagnosis not present

## 2019-01-19 DIAGNOSIS — R32 Unspecified urinary incontinence: Secondary | ICD-10-CM | POA: Diagnosis not present

## 2019-01-19 DIAGNOSIS — K5909 Other constipation: Secondary | ICD-10-CM | POA: Diagnosis not present

## 2019-01-19 DIAGNOSIS — G309 Alzheimer's disease, unspecified: Secondary | ICD-10-CM | POA: Diagnosis not present

## 2019-01-19 DIAGNOSIS — C9110 Chronic lymphocytic leukemia of B-cell type not having achieved remission: Secondary | ICD-10-CM | POA: Diagnosis not present

## 2019-01-19 DIAGNOSIS — R590 Localized enlarged lymph nodes: Secondary | ICD-10-CM | POA: Diagnosis not present

## 2019-01-21 DIAGNOSIS — G309 Alzheimer's disease, unspecified: Secondary | ICD-10-CM | POA: Diagnosis not present

## 2019-01-21 DIAGNOSIS — R590 Localized enlarged lymph nodes: Secondary | ICD-10-CM | POA: Diagnosis not present

## 2019-01-21 DIAGNOSIS — M545 Low back pain: Secondary | ICD-10-CM | POA: Diagnosis not present

## 2019-01-21 DIAGNOSIS — B356 Tinea cruris: Secondary | ICD-10-CM | POA: Diagnosis not present

## 2019-01-21 DIAGNOSIS — M109 Gout, unspecified: Secondary | ICD-10-CM | POA: Diagnosis not present

## 2019-01-21 DIAGNOSIS — Z87440 Personal history of urinary (tract) infections: Secondary | ICD-10-CM | POA: Diagnosis not present

## 2019-01-21 DIAGNOSIS — I10 Essential (primary) hypertension: Secondary | ICD-10-CM | POA: Diagnosis not present

## 2019-01-21 DIAGNOSIS — F418 Other specified anxiety disorders: Secondary | ICD-10-CM | POA: Diagnosis not present

## 2019-01-21 DIAGNOSIS — E559 Vitamin D deficiency, unspecified: Secondary | ICD-10-CM | POA: Diagnosis not present

## 2019-01-21 DIAGNOSIS — R32 Unspecified urinary incontinence: Secondary | ICD-10-CM | POA: Diagnosis not present

## 2019-01-21 DIAGNOSIS — K5909 Other constipation: Secondary | ICD-10-CM | POA: Diagnosis not present

## 2019-01-21 DIAGNOSIS — E119 Type 2 diabetes mellitus without complications: Secondary | ICD-10-CM | POA: Diagnosis not present

## 2019-01-21 DIAGNOSIS — Z9119 Patient's noncompliance with other medical treatment and regimen: Secondary | ICD-10-CM | POA: Diagnosis not present

## 2019-01-21 DIAGNOSIS — E7800 Pure hypercholesterolemia, unspecified: Secondary | ICD-10-CM | POA: Diagnosis not present

## 2019-01-21 DIAGNOSIS — R197 Diarrhea, unspecified: Secondary | ICD-10-CM | POA: Diagnosis not present

## 2019-01-21 DIAGNOSIS — R160 Hepatomegaly, not elsewhere classified: Secondary | ICD-10-CM | POA: Diagnosis not present

## 2019-01-21 DIAGNOSIS — D649 Anemia, unspecified: Secondary | ICD-10-CM | POA: Diagnosis not present

## 2019-01-21 DIAGNOSIS — C9110 Chronic lymphocytic leukemia of B-cell type not having achieved remission: Secondary | ICD-10-CM | POA: Diagnosis not present

## 2019-01-22 DIAGNOSIS — C9110 Chronic lymphocytic leukemia of B-cell type not having achieved remission: Secondary | ICD-10-CM | POA: Diagnosis not present

## 2019-01-22 DIAGNOSIS — R590 Localized enlarged lymph nodes: Secondary | ICD-10-CM | POA: Diagnosis not present

## 2019-01-22 DIAGNOSIS — K5909 Other constipation: Secondary | ICD-10-CM | POA: Diagnosis not present

## 2019-01-22 DIAGNOSIS — G309 Alzheimer's disease, unspecified: Secondary | ICD-10-CM | POA: Diagnosis not present

## 2019-01-22 DIAGNOSIS — M545 Low back pain: Secondary | ICD-10-CM | POA: Diagnosis not present

## 2019-01-22 DIAGNOSIS — R32 Unspecified urinary incontinence: Secondary | ICD-10-CM | POA: Diagnosis not present

## 2019-01-24 DIAGNOSIS — K5909 Other constipation: Secondary | ICD-10-CM | POA: Diagnosis not present

## 2019-01-24 DIAGNOSIS — M545 Low back pain: Secondary | ICD-10-CM | POA: Diagnosis not present

## 2019-01-24 DIAGNOSIS — C9110 Chronic lymphocytic leukemia of B-cell type not having achieved remission: Secondary | ICD-10-CM | POA: Diagnosis not present

## 2019-01-24 DIAGNOSIS — G309 Alzheimer's disease, unspecified: Secondary | ICD-10-CM | POA: Diagnosis not present

## 2019-01-24 DIAGNOSIS — R590 Localized enlarged lymph nodes: Secondary | ICD-10-CM | POA: Diagnosis not present

## 2019-01-24 DIAGNOSIS — R32 Unspecified urinary incontinence: Secondary | ICD-10-CM | POA: Diagnosis not present

## 2019-01-25 DIAGNOSIS — C9110 Chronic lymphocytic leukemia of B-cell type not having achieved remission: Secondary | ICD-10-CM | POA: Diagnosis not present

## 2019-01-25 DIAGNOSIS — R32 Unspecified urinary incontinence: Secondary | ICD-10-CM | POA: Diagnosis not present

## 2019-01-25 DIAGNOSIS — G309 Alzheimer's disease, unspecified: Secondary | ICD-10-CM | POA: Diagnosis not present

## 2019-01-25 DIAGNOSIS — M545 Low back pain: Secondary | ICD-10-CM | POA: Diagnosis not present

## 2019-01-25 DIAGNOSIS — R590 Localized enlarged lymph nodes: Secondary | ICD-10-CM | POA: Diagnosis not present

## 2019-01-25 DIAGNOSIS — K5909 Other constipation: Secondary | ICD-10-CM | POA: Diagnosis not present

## 2019-01-29 DIAGNOSIS — R32 Unspecified urinary incontinence: Secondary | ICD-10-CM | POA: Diagnosis not present

## 2019-01-29 DIAGNOSIS — G309 Alzheimer's disease, unspecified: Secondary | ICD-10-CM | POA: Diagnosis not present

## 2019-01-29 DIAGNOSIS — K5909 Other constipation: Secondary | ICD-10-CM | POA: Diagnosis not present

## 2019-01-29 DIAGNOSIS — C9110 Chronic lymphocytic leukemia of B-cell type not having achieved remission: Secondary | ICD-10-CM | POA: Diagnosis not present

## 2019-01-29 DIAGNOSIS — R590 Localized enlarged lymph nodes: Secondary | ICD-10-CM | POA: Diagnosis not present

## 2019-01-29 DIAGNOSIS — M545 Low back pain: Secondary | ICD-10-CM | POA: Diagnosis not present

## 2019-01-30 DIAGNOSIS — K5909 Other constipation: Secondary | ICD-10-CM | POA: Diagnosis not present

## 2019-01-30 DIAGNOSIS — R590 Localized enlarged lymph nodes: Secondary | ICD-10-CM | POA: Diagnosis not present

## 2019-01-30 DIAGNOSIS — R32 Unspecified urinary incontinence: Secondary | ICD-10-CM | POA: Diagnosis not present

## 2019-01-30 DIAGNOSIS — G309 Alzheimer's disease, unspecified: Secondary | ICD-10-CM | POA: Diagnosis not present

## 2019-01-30 DIAGNOSIS — M545 Low back pain: Secondary | ICD-10-CM | POA: Diagnosis not present

## 2019-01-30 DIAGNOSIS — C9110 Chronic lymphocytic leukemia of B-cell type not having achieved remission: Secondary | ICD-10-CM | POA: Diagnosis not present

## 2019-01-31 DIAGNOSIS — M545 Low back pain: Secondary | ICD-10-CM | POA: Diagnosis not present

## 2019-01-31 DIAGNOSIS — R32 Unspecified urinary incontinence: Secondary | ICD-10-CM | POA: Diagnosis not present

## 2019-01-31 DIAGNOSIS — K5909 Other constipation: Secondary | ICD-10-CM | POA: Diagnosis not present

## 2019-01-31 DIAGNOSIS — C9110 Chronic lymphocytic leukemia of B-cell type not having achieved remission: Secondary | ICD-10-CM | POA: Diagnosis not present

## 2019-01-31 DIAGNOSIS — R590 Localized enlarged lymph nodes: Secondary | ICD-10-CM | POA: Diagnosis not present

## 2019-01-31 DIAGNOSIS — G309 Alzheimer's disease, unspecified: Secondary | ICD-10-CM | POA: Diagnosis not present

## 2019-02-02 DIAGNOSIS — R32 Unspecified urinary incontinence: Secondary | ICD-10-CM | POA: Diagnosis not present

## 2019-02-02 DIAGNOSIS — M545 Low back pain: Secondary | ICD-10-CM | POA: Diagnosis not present

## 2019-02-02 DIAGNOSIS — R590 Localized enlarged lymph nodes: Secondary | ICD-10-CM | POA: Diagnosis not present

## 2019-02-02 DIAGNOSIS — G309 Alzheimer's disease, unspecified: Secondary | ICD-10-CM | POA: Diagnosis not present

## 2019-02-02 DIAGNOSIS — K5909 Other constipation: Secondary | ICD-10-CM | POA: Diagnosis not present

## 2019-02-02 DIAGNOSIS — C9110 Chronic lymphocytic leukemia of B-cell type not having achieved remission: Secondary | ICD-10-CM | POA: Diagnosis not present

## 2019-02-05 DIAGNOSIS — R590 Localized enlarged lymph nodes: Secondary | ICD-10-CM | POA: Diagnosis not present

## 2019-02-05 DIAGNOSIS — G309 Alzheimer's disease, unspecified: Secondary | ICD-10-CM | POA: Diagnosis not present

## 2019-02-05 DIAGNOSIS — M545 Low back pain: Secondary | ICD-10-CM | POA: Diagnosis not present

## 2019-02-05 DIAGNOSIS — C9110 Chronic lymphocytic leukemia of B-cell type not having achieved remission: Secondary | ICD-10-CM | POA: Diagnosis not present

## 2019-02-05 DIAGNOSIS — R32 Unspecified urinary incontinence: Secondary | ICD-10-CM | POA: Diagnosis not present

## 2019-02-05 DIAGNOSIS — K5909 Other constipation: Secondary | ICD-10-CM | POA: Diagnosis not present

## 2019-02-07 DIAGNOSIS — K5909 Other constipation: Secondary | ICD-10-CM | POA: Diagnosis not present

## 2019-02-07 DIAGNOSIS — R32 Unspecified urinary incontinence: Secondary | ICD-10-CM | POA: Diagnosis not present

## 2019-02-07 DIAGNOSIS — G309 Alzheimer's disease, unspecified: Secondary | ICD-10-CM | POA: Diagnosis not present

## 2019-02-07 DIAGNOSIS — C9110 Chronic lymphocytic leukemia of B-cell type not having achieved remission: Secondary | ICD-10-CM | POA: Diagnosis not present

## 2019-02-07 DIAGNOSIS — M545 Low back pain: Secondary | ICD-10-CM | POA: Diagnosis not present

## 2019-02-07 DIAGNOSIS — R590 Localized enlarged lymph nodes: Secondary | ICD-10-CM | POA: Diagnosis not present

## 2019-02-09 DIAGNOSIS — G309 Alzheimer's disease, unspecified: Secondary | ICD-10-CM | POA: Diagnosis not present

## 2019-02-09 DIAGNOSIS — K5909 Other constipation: Secondary | ICD-10-CM | POA: Diagnosis not present

## 2019-02-09 DIAGNOSIS — R32 Unspecified urinary incontinence: Secondary | ICD-10-CM | POA: Diagnosis not present

## 2019-02-09 DIAGNOSIS — R590 Localized enlarged lymph nodes: Secondary | ICD-10-CM | POA: Diagnosis not present

## 2019-02-09 DIAGNOSIS — C9110 Chronic lymphocytic leukemia of B-cell type not having achieved remission: Secondary | ICD-10-CM | POA: Diagnosis not present

## 2019-02-09 DIAGNOSIS — M545 Low back pain: Secondary | ICD-10-CM | POA: Diagnosis not present

## 2019-02-12 DIAGNOSIS — R32 Unspecified urinary incontinence: Secondary | ICD-10-CM | POA: Diagnosis not present

## 2019-02-12 DIAGNOSIS — K5909 Other constipation: Secondary | ICD-10-CM | POA: Diagnosis not present

## 2019-02-12 DIAGNOSIS — G309 Alzheimer's disease, unspecified: Secondary | ICD-10-CM | POA: Diagnosis not present

## 2019-02-12 DIAGNOSIS — R590 Localized enlarged lymph nodes: Secondary | ICD-10-CM | POA: Diagnosis not present

## 2019-02-12 DIAGNOSIS — M545 Low back pain: Secondary | ICD-10-CM | POA: Diagnosis not present

## 2019-02-12 DIAGNOSIS — C9110 Chronic lymphocytic leukemia of B-cell type not having achieved remission: Secondary | ICD-10-CM | POA: Diagnosis not present

## 2019-02-14 DIAGNOSIS — K5909 Other constipation: Secondary | ICD-10-CM | POA: Diagnosis not present

## 2019-02-14 DIAGNOSIS — C9110 Chronic lymphocytic leukemia of B-cell type not having achieved remission: Secondary | ICD-10-CM | POA: Diagnosis not present

## 2019-02-14 DIAGNOSIS — R590 Localized enlarged lymph nodes: Secondary | ICD-10-CM | POA: Diagnosis not present

## 2019-02-14 DIAGNOSIS — G309 Alzheimer's disease, unspecified: Secondary | ICD-10-CM | POA: Diagnosis not present

## 2019-02-14 DIAGNOSIS — R32 Unspecified urinary incontinence: Secondary | ICD-10-CM | POA: Diagnosis not present

## 2019-02-14 DIAGNOSIS — M545 Low back pain: Secondary | ICD-10-CM | POA: Diagnosis not present

## 2019-02-15 DIAGNOSIS — R590 Localized enlarged lymph nodes: Secondary | ICD-10-CM | POA: Diagnosis not present

## 2019-02-15 DIAGNOSIS — M545 Low back pain: Secondary | ICD-10-CM | POA: Diagnosis not present

## 2019-02-15 DIAGNOSIS — R32 Unspecified urinary incontinence: Secondary | ICD-10-CM | POA: Diagnosis not present

## 2019-02-15 DIAGNOSIS — C9110 Chronic lymphocytic leukemia of B-cell type not having achieved remission: Secondary | ICD-10-CM | POA: Diagnosis not present

## 2019-02-15 DIAGNOSIS — K5909 Other constipation: Secondary | ICD-10-CM | POA: Diagnosis not present

## 2019-02-15 DIAGNOSIS — G309 Alzheimer's disease, unspecified: Secondary | ICD-10-CM | POA: Diagnosis not present

## 2019-02-16 DIAGNOSIS — K5909 Other constipation: Secondary | ICD-10-CM | POA: Diagnosis not present

## 2019-02-16 DIAGNOSIS — R32 Unspecified urinary incontinence: Secondary | ICD-10-CM | POA: Diagnosis not present

## 2019-02-16 DIAGNOSIS — G309 Alzheimer's disease, unspecified: Secondary | ICD-10-CM | POA: Diagnosis not present

## 2019-02-16 DIAGNOSIS — C9110 Chronic lymphocytic leukemia of B-cell type not having achieved remission: Secondary | ICD-10-CM | POA: Diagnosis not present

## 2019-02-16 DIAGNOSIS — M545 Low back pain: Secondary | ICD-10-CM | POA: Diagnosis not present

## 2019-02-16 DIAGNOSIS — R590 Localized enlarged lymph nodes: Secondary | ICD-10-CM | POA: Diagnosis not present

## 2019-02-19 DIAGNOSIS — G309 Alzheimer's disease, unspecified: Secondary | ICD-10-CM | POA: Diagnosis not present

## 2019-02-19 DIAGNOSIS — R590 Localized enlarged lymph nodes: Secondary | ICD-10-CM | POA: Diagnosis not present

## 2019-02-19 DIAGNOSIS — M545 Low back pain: Secondary | ICD-10-CM | POA: Diagnosis not present

## 2019-02-19 DIAGNOSIS — K5909 Other constipation: Secondary | ICD-10-CM | POA: Diagnosis not present

## 2019-02-19 DIAGNOSIS — R32 Unspecified urinary incontinence: Secondary | ICD-10-CM | POA: Diagnosis not present

## 2019-02-19 DIAGNOSIS — C9110 Chronic lymphocytic leukemia of B-cell type not having achieved remission: Secondary | ICD-10-CM | POA: Diagnosis not present

## 2019-02-20 DIAGNOSIS — K5909 Other constipation: Secondary | ICD-10-CM | POA: Diagnosis not present

## 2019-02-20 DIAGNOSIS — C9110 Chronic lymphocytic leukemia of B-cell type not having achieved remission: Secondary | ICD-10-CM | POA: Diagnosis not present

## 2019-02-20 DIAGNOSIS — R590 Localized enlarged lymph nodes: Secondary | ICD-10-CM | POA: Diagnosis not present

## 2019-02-20 DIAGNOSIS — R32 Unspecified urinary incontinence: Secondary | ICD-10-CM | POA: Diagnosis not present

## 2019-02-20 DIAGNOSIS — G309 Alzheimer's disease, unspecified: Secondary | ICD-10-CM | POA: Diagnosis not present

## 2019-02-20 DIAGNOSIS — M545 Low back pain: Secondary | ICD-10-CM | POA: Diagnosis not present

## 2019-02-21 DIAGNOSIS — I10 Essential (primary) hypertension: Secondary | ICD-10-CM | POA: Diagnosis not present

## 2019-02-21 DIAGNOSIS — C9110 Chronic lymphocytic leukemia of B-cell type not having achieved remission: Secondary | ICD-10-CM | POA: Diagnosis not present

## 2019-02-21 DIAGNOSIS — D649 Anemia, unspecified: Secondary | ICD-10-CM | POA: Diagnosis not present

## 2019-02-21 DIAGNOSIS — Z9119 Patient's noncompliance with other medical treatment and regimen: Secondary | ICD-10-CM | POA: Diagnosis not present

## 2019-02-21 DIAGNOSIS — B356 Tinea cruris: Secondary | ICD-10-CM | POA: Diagnosis not present

## 2019-02-21 DIAGNOSIS — M545 Low back pain: Secondary | ICD-10-CM | POA: Diagnosis not present

## 2019-02-21 DIAGNOSIS — R160 Hepatomegaly, not elsewhere classified: Secondary | ICD-10-CM | POA: Diagnosis not present

## 2019-02-21 DIAGNOSIS — F418 Other specified anxiety disorders: Secondary | ICD-10-CM | POA: Diagnosis not present

## 2019-02-21 DIAGNOSIS — Z87440 Personal history of urinary (tract) infections: Secondary | ICD-10-CM | POA: Diagnosis not present

## 2019-02-21 DIAGNOSIS — G309 Alzheimer's disease, unspecified: Secondary | ICD-10-CM | POA: Diagnosis not present

## 2019-02-21 DIAGNOSIS — K5909 Other constipation: Secondary | ICD-10-CM | POA: Diagnosis not present

## 2019-02-21 DIAGNOSIS — M109 Gout, unspecified: Secondary | ICD-10-CM | POA: Diagnosis not present

## 2019-02-21 DIAGNOSIS — R32 Unspecified urinary incontinence: Secondary | ICD-10-CM | POA: Diagnosis not present

## 2019-02-21 DIAGNOSIS — R197 Diarrhea, unspecified: Secondary | ICD-10-CM | POA: Diagnosis not present

## 2019-02-21 DIAGNOSIS — E119 Type 2 diabetes mellitus without complications: Secondary | ICD-10-CM | POA: Diagnosis not present

## 2019-02-21 DIAGNOSIS — R590 Localized enlarged lymph nodes: Secondary | ICD-10-CM | POA: Diagnosis not present

## 2019-02-21 DIAGNOSIS — E7800 Pure hypercholesterolemia, unspecified: Secondary | ICD-10-CM | POA: Diagnosis not present

## 2019-02-21 DIAGNOSIS — E559 Vitamin D deficiency, unspecified: Secondary | ICD-10-CM | POA: Diagnosis not present

## 2019-02-23 DIAGNOSIS — R32 Unspecified urinary incontinence: Secondary | ICD-10-CM | POA: Diagnosis not present

## 2019-02-23 DIAGNOSIS — C9110 Chronic lymphocytic leukemia of B-cell type not having achieved remission: Secondary | ICD-10-CM | POA: Diagnosis not present

## 2019-02-23 DIAGNOSIS — G309 Alzheimer's disease, unspecified: Secondary | ICD-10-CM | POA: Diagnosis not present

## 2019-02-23 DIAGNOSIS — K5909 Other constipation: Secondary | ICD-10-CM | POA: Diagnosis not present

## 2019-02-23 DIAGNOSIS — M545 Low back pain: Secondary | ICD-10-CM | POA: Diagnosis not present

## 2019-02-23 DIAGNOSIS — R590 Localized enlarged lymph nodes: Secondary | ICD-10-CM | POA: Diagnosis not present

## 2019-02-26 DIAGNOSIS — R590 Localized enlarged lymph nodes: Secondary | ICD-10-CM | POA: Diagnosis not present

## 2019-02-26 DIAGNOSIS — R32 Unspecified urinary incontinence: Secondary | ICD-10-CM | POA: Diagnosis not present

## 2019-02-26 DIAGNOSIS — M545 Low back pain: Secondary | ICD-10-CM | POA: Diagnosis not present

## 2019-02-26 DIAGNOSIS — G309 Alzheimer's disease, unspecified: Secondary | ICD-10-CM | POA: Diagnosis not present

## 2019-02-26 DIAGNOSIS — C9110 Chronic lymphocytic leukemia of B-cell type not having achieved remission: Secondary | ICD-10-CM | POA: Diagnosis not present

## 2019-02-26 DIAGNOSIS — K5909 Other constipation: Secondary | ICD-10-CM | POA: Diagnosis not present

## 2019-02-28 DIAGNOSIS — K5909 Other constipation: Secondary | ICD-10-CM | POA: Diagnosis not present

## 2019-02-28 DIAGNOSIS — R590 Localized enlarged lymph nodes: Secondary | ICD-10-CM | POA: Diagnosis not present

## 2019-02-28 DIAGNOSIS — M545 Low back pain: Secondary | ICD-10-CM | POA: Diagnosis not present

## 2019-02-28 DIAGNOSIS — G309 Alzheimer's disease, unspecified: Secondary | ICD-10-CM | POA: Diagnosis not present

## 2019-02-28 DIAGNOSIS — R32 Unspecified urinary incontinence: Secondary | ICD-10-CM | POA: Diagnosis not present

## 2019-02-28 DIAGNOSIS — C9110 Chronic lymphocytic leukemia of B-cell type not having achieved remission: Secondary | ICD-10-CM | POA: Diagnosis not present

## 2019-03-01 DIAGNOSIS — R32 Unspecified urinary incontinence: Secondary | ICD-10-CM | POA: Diagnosis not present

## 2019-03-01 DIAGNOSIS — M545 Low back pain: Secondary | ICD-10-CM | POA: Diagnosis not present

## 2019-03-01 DIAGNOSIS — R590 Localized enlarged lymph nodes: Secondary | ICD-10-CM | POA: Diagnosis not present

## 2019-03-01 DIAGNOSIS — G309 Alzheimer's disease, unspecified: Secondary | ICD-10-CM | POA: Diagnosis not present

## 2019-03-01 DIAGNOSIS — K5909 Other constipation: Secondary | ICD-10-CM | POA: Diagnosis not present

## 2019-03-01 DIAGNOSIS — C9110 Chronic lymphocytic leukemia of B-cell type not having achieved remission: Secondary | ICD-10-CM | POA: Diagnosis not present

## 2019-03-02 DIAGNOSIS — G309 Alzheimer's disease, unspecified: Secondary | ICD-10-CM | POA: Diagnosis not present

## 2019-03-02 DIAGNOSIS — C9110 Chronic lymphocytic leukemia of B-cell type not having achieved remission: Secondary | ICD-10-CM | POA: Diagnosis not present

## 2019-03-02 DIAGNOSIS — R590 Localized enlarged lymph nodes: Secondary | ICD-10-CM | POA: Diagnosis not present

## 2019-03-02 DIAGNOSIS — R32 Unspecified urinary incontinence: Secondary | ICD-10-CM | POA: Diagnosis not present

## 2019-03-02 DIAGNOSIS — K5909 Other constipation: Secondary | ICD-10-CM | POA: Diagnosis not present

## 2019-03-02 DIAGNOSIS — M545 Low back pain: Secondary | ICD-10-CM | POA: Diagnosis not present

## 2019-03-05 DIAGNOSIS — C9110 Chronic lymphocytic leukemia of B-cell type not having achieved remission: Secondary | ICD-10-CM | POA: Diagnosis not present

## 2019-03-05 DIAGNOSIS — G309 Alzheimer's disease, unspecified: Secondary | ICD-10-CM | POA: Diagnosis not present

## 2019-03-05 DIAGNOSIS — R32 Unspecified urinary incontinence: Secondary | ICD-10-CM | POA: Diagnosis not present

## 2019-03-05 DIAGNOSIS — R590 Localized enlarged lymph nodes: Secondary | ICD-10-CM | POA: Diagnosis not present

## 2019-03-05 DIAGNOSIS — K5909 Other constipation: Secondary | ICD-10-CM | POA: Diagnosis not present

## 2019-03-05 DIAGNOSIS — M545 Low back pain: Secondary | ICD-10-CM | POA: Diagnosis not present

## 2019-03-06 DIAGNOSIS — R590 Localized enlarged lymph nodes: Secondary | ICD-10-CM | POA: Diagnosis not present

## 2019-03-06 DIAGNOSIS — C9110 Chronic lymphocytic leukemia of B-cell type not having achieved remission: Secondary | ICD-10-CM | POA: Diagnosis not present

## 2019-03-06 DIAGNOSIS — K5909 Other constipation: Secondary | ICD-10-CM | POA: Diagnosis not present

## 2019-03-06 DIAGNOSIS — R32 Unspecified urinary incontinence: Secondary | ICD-10-CM | POA: Diagnosis not present

## 2019-03-06 DIAGNOSIS — G309 Alzheimer's disease, unspecified: Secondary | ICD-10-CM | POA: Diagnosis not present

## 2019-03-06 DIAGNOSIS — M545 Low back pain: Secondary | ICD-10-CM | POA: Diagnosis not present

## 2019-03-08 DIAGNOSIS — G309 Alzheimer's disease, unspecified: Secondary | ICD-10-CM | POA: Diagnosis not present

## 2019-03-08 DIAGNOSIS — C9110 Chronic lymphocytic leukemia of B-cell type not having achieved remission: Secondary | ICD-10-CM | POA: Diagnosis not present

## 2019-03-08 DIAGNOSIS — M545 Low back pain: Secondary | ICD-10-CM | POA: Diagnosis not present

## 2019-03-08 DIAGNOSIS — R32 Unspecified urinary incontinence: Secondary | ICD-10-CM | POA: Diagnosis not present

## 2019-03-08 DIAGNOSIS — R590 Localized enlarged lymph nodes: Secondary | ICD-10-CM | POA: Diagnosis not present

## 2019-03-08 DIAGNOSIS — K5909 Other constipation: Secondary | ICD-10-CM | POA: Diagnosis not present

## 2019-03-12 DIAGNOSIS — K5909 Other constipation: Secondary | ICD-10-CM | POA: Diagnosis not present

## 2019-03-12 DIAGNOSIS — R590 Localized enlarged lymph nodes: Secondary | ICD-10-CM | POA: Diagnosis not present

## 2019-03-12 DIAGNOSIS — C9110 Chronic lymphocytic leukemia of B-cell type not having achieved remission: Secondary | ICD-10-CM | POA: Diagnosis not present

## 2019-03-12 DIAGNOSIS — R32 Unspecified urinary incontinence: Secondary | ICD-10-CM | POA: Diagnosis not present

## 2019-03-12 DIAGNOSIS — G309 Alzheimer's disease, unspecified: Secondary | ICD-10-CM | POA: Diagnosis not present

## 2019-03-12 DIAGNOSIS — M545 Low back pain: Secondary | ICD-10-CM | POA: Diagnosis not present

## 2019-03-13 DIAGNOSIS — R590 Localized enlarged lymph nodes: Secondary | ICD-10-CM | POA: Diagnosis not present

## 2019-03-13 DIAGNOSIS — M545 Low back pain: Secondary | ICD-10-CM | POA: Diagnosis not present

## 2019-03-13 DIAGNOSIS — G309 Alzheimer's disease, unspecified: Secondary | ICD-10-CM | POA: Diagnosis not present

## 2019-03-13 DIAGNOSIS — K5909 Other constipation: Secondary | ICD-10-CM | POA: Diagnosis not present

## 2019-03-13 DIAGNOSIS — R32 Unspecified urinary incontinence: Secondary | ICD-10-CM | POA: Diagnosis not present

## 2019-03-13 DIAGNOSIS — C9110 Chronic lymphocytic leukemia of B-cell type not having achieved remission: Secondary | ICD-10-CM | POA: Diagnosis not present

## 2019-03-15 DIAGNOSIS — K5909 Other constipation: Secondary | ICD-10-CM | POA: Diagnosis not present

## 2019-03-15 DIAGNOSIS — M545 Low back pain: Secondary | ICD-10-CM | POA: Diagnosis not present

## 2019-03-15 DIAGNOSIS — R32 Unspecified urinary incontinence: Secondary | ICD-10-CM | POA: Diagnosis not present

## 2019-03-15 DIAGNOSIS — G309 Alzheimer's disease, unspecified: Secondary | ICD-10-CM | POA: Diagnosis not present

## 2019-03-15 DIAGNOSIS — R590 Localized enlarged lymph nodes: Secondary | ICD-10-CM | POA: Diagnosis not present

## 2019-03-15 DIAGNOSIS — C9110 Chronic lymphocytic leukemia of B-cell type not having achieved remission: Secondary | ICD-10-CM | POA: Diagnosis not present

## 2019-03-19 DIAGNOSIS — C9110 Chronic lymphocytic leukemia of B-cell type not having achieved remission: Secondary | ICD-10-CM | POA: Diagnosis not present

## 2019-03-19 DIAGNOSIS — G309 Alzheimer's disease, unspecified: Secondary | ICD-10-CM | POA: Diagnosis not present

## 2019-03-19 DIAGNOSIS — R590 Localized enlarged lymph nodes: Secondary | ICD-10-CM | POA: Diagnosis not present

## 2019-03-19 DIAGNOSIS — K5909 Other constipation: Secondary | ICD-10-CM | POA: Diagnosis not present

## 2019-03-19 DIAGNOSIS — M545 Low back pain: Secondary | ICD-10-CM | POA: Diagnosis not present

## 2019-03-19 DIAGNOSIS — R32 Unspecified urinary incontinence: Secondary | ICD-10-CM | POA: Diagnosis not present

## 2019-03-20 DIAGNOSIS — C9110 Chronic lymphocytic leukemia of B-cell type not having achieved remission: Secondary | ICD-10-CM | POA: Diagnosis not present

## 2019-03-20 DIAGNOSIS — K5909 Other constipation: Secondary | ICD-10-CM | POA: Diagnosis not present

## 2019-03-20 DIAGNOSIS — R32 Unspecified urinary incontinence: Secondary | ICD-10-CM | POA: Diagnosis not present

## 2019-03-20 DIAGNOSIS — R590 Localized enlarged lymph nodes: Secondary | ICD-10-CM | POA: Diagnosis not present

## 2019-03-20 DIAGNOSIS — M545 Low back pain: Secondary | ICD-10-CM | POA: Diagnosis not present

## 2019-03-20 DIAGNOSIS — G309 Alzheimer's disease, unspecified: Secondary | ICD-10-CM | POA: Diagnosis not present

## 2019-03-22 DIAGNOSIS — R590 Localized enlarged lymph nodes: Secondary | ICD-10-CM | POA: Diagnosis not present

## 2019-03-22 DIAGNOSIS — K5909 Other constipation: Secondary | ICD-10-CM | POA: Diagnosis not present

## 2019-03-22 DIAGNOSIS — R32 Unspecified urinary incontinence: Secondary | ICD-10-CM | POA: Diagnosis not present

## 2019-03-22 DIAGNOSIS — C9110 Chronic lymphocytic leukemia of B-cell type not having achieved remission: Secondary | ICD-10-CM | POA: Diagnosis not present

## 2019-03-22 DIAGNOSIS — G309 Alzheimer's disease, unspecified: Secondary | ICD-10-CM | POA: Diagnosis not present

## 2019-03-22 DIAGNOSIS — M545 Low back pain: Secondary | ICD-10-CM | POA: Diagnosis not present

## 2019-03-23 DIAGNOSIS — R160 Hepatomegaly, not elsewhere classified: Secondary | ICD-10-CM | POA: Diagnosis not present

## 2019-03-23 DIAGNOSIS — Z9119 Patient's noncompliance with other medical treatment and regimen: Secondary | ICD-10-CM | POA: Diagnosis not present

## 2019-03-23 DIAGNOSIS — E559 Vitamin D deficiency, unspecified: Secondary | ICD-10-CM | POA: Diagnosis not present

## 2019-03-23 DIAGNOSIS — C9110 Chronic lymphocytic leukemia of B-cell type not having achieved remission: Secondary | ICD-10-CM | POA: Diagnosis not present

## 2019-03-23 DIAGNOSIS — B356 Tinea cruris: Secondary | ICD-10-CM | POA: Diagnosis not present

## 2019-03-23 DIAGNOSIS — R197 Diarrhea, unspecified: Secondary | ICD-10-CM | POA: Diagnosis not present

## 2019-03-23 DIAGNOSIS — F418 Other specified anxiety disorders: Secondary | ICD-10-CM | POA: Diagnosis not present

## 2019-03-23 DIAGNOSIS — M109 Gout, unspecified: Secondary | ICD-10-CM | POA: Diagnosis not present

## 2019-03-23 DIAGNOSIS — M545 Low back pain: Secondary | ICD-10-CM | POA: Diagnosis not present

## 2019-03-23 DIAGNOSIS — Z87440 Personal history of urinary (tract) infections: Secondary | ICD-10-CM | POA: Diagnosis not present

## 2019-03-23 DIAGNOSIS — R590 Localized enlarged lymph nodes: Secondary | ICD-10-CM | POA: Diagnosis not present

## 2019-03-23 DIAGNOSIS — G309 Alzheimer's disease, unspecified: Secondary | ICD-10-CM | POA: Diagnosis not present

## 2019-03-23 DIAGNOSIS — D649 Anemia, unspecified: Secondary | ICD-10-CM | POA: Diagnosis not present

## 2019-03-23 DIAGNOSIS — R32 Unspecified urinary incontinence: Secondary | ICD-10-CM | POA: Diagnosis not present

## 2019-03-23 DIAGNOSIS — E7800 Pure hypercholesterolemia, unspecified: Secondary | ICD-10-CM | POA: Diagnosis not present

## 2019-03-23 DIAGNOSIS — E119 Type 2 diabetes mellitus without complications: Secondary | ICD-10-CM | POA: Diagnosis not present

## 2019-03-23 DIAGNOSIS — I10 Essential (primary) hypertension: Secondary | ICD-10-CM | POA: Diagnosis not present

## 2019-03-23 DIAGNOSIS — K5909 Other constipation: Secondary | ICD-10-CM | POA: Diagnosis not present

## 2019-03-26 DIAGNOSIS — M545 Low back pain: Secondary | ICD-10-CM | POA: Diagnosis not present

## 2019-03-26 DIAGNOSIS — R590 Localized enlarged lymph nodes: Secondary | ICD-10-CM | POA: Diagnosis not present

## 2019-03-26 DIAGNOSIS — K5909 Other constipation: Secondary | ICD-10-CM | POA: Diagnosis not present

## 2019-03-26 DIAGNOSIS — C9110 Chronic lymphocytic leukemia of B-cell type not having achieved remission: Secondary | ICD-10-CM | POA: Diagnosis not present

## 2019-03-26 DIAGNOSIS — R32 Unspecified urinary incontinence: Secondary | ICD-10-CM | POA: Diagnosis not present

## 2019-03-26 DIAGNOSIS — G309 Alzheimer's disease, unspecified: Secondary | ICD-10-CM | POA: Diagnosis not present

## 2019-03-27 DIAGNOSIS — R590 Localized enlarged lymph nodes: Secondary | ICD-10-CM | POA: Diagnosis not present

## 2019-03-27 DIAGNOSIS — G309 Alzheimer's disease, unspecified: Secondary | ICD-10-CM | POA: Diagnosis not present

## 2019-03-27 DIAGNOSIS — K5909 Other constipation: Secondary | ICD-10-CM | POA: Diagnosis not present

## 2019-03-27 DIAGNOSIS — R32 Unspecified urinary incontinence: Secondary | ICD-10-CM | POA: Diagnosis not present

## 2019-03-27 DIAGNOSIS — M545 Low back pain: Secondary | ICD-10-CM | POA: Diagnosis not present

## 2019-03-27 DIAGNOSIS — C9110 Chronic lymphocytic leukemia of B-cell type not having achieved remission: Secondary | ICD-10-CM | POA: Diagnosis not present

## 2019-03-29 DIAGNOSIS — G309 Alzheimer's disease, unspecified: Secondary | ICD-10-CM | POA: Diagnosis not present

## 2019-03-29 DIAGNOSIS — K5909 Other constipation: Secondary | ICD-10-CM | POA: Diagnosis not present

## 2019-03-29 DIAGNOSIS — R32 Unspecified urinary incontinence: Secondary | ICD-10-CM | POA: Diagnosis not present

## 2019-03-29 DIAGNOSIS — C9110 Chronic lymphocytic leukemia of B-cell type not having achieved remission: Secondary | ICD-10-CM | POA: Diagnosis not present

## 2019-03-29 DIAGNOSIS — R590 Localized enlarged lymph nodes: Secondary | ICD-10-CM | POA: Diagnosis not present

## 2019-03-29 DIAGNOSIS — M545 Low back pain: Secondary | ICD-10-CM | POA: Diagnosis not present

## 2019-04-02 DIAGNOSIS — K5909 Other constipation: Secondary | ICD-10-CM | POA: Diagnosis not present

## 2019-04-02 DIAGNOSIS — R32 Unspecified urinary incontinence: Secondary | ICD-10-CM | POA: Diagnosis not present

## 2019-04-02 DIAGNOSIS — G309 Alzheimer's disease, unspecified: Secondary | ICD-10-CM | POA: Diagnosis not present

## 2019-04-02 DIAGNOSIS — M545 Low back pain: Secondary | ICD-10-CM | POA: Diagnosis not present

## 2019-04-02 DIAGNOSIS — R590 Localized enlarged lymph nodes: Secondary | ICD-10-CM | POA: Diagnosis not present

## 2019-04-02 DIAGNOSIS — C9110 Chronic lymphocytic leukemia of B-cell type not having achieved remission: Secondary | ICD-10-CM | POA: Diagnosis not present

## 2019-04-03 DIAGNOSIS — G309 Alzheimer's disease, unspecified: Secondary | ICD-10-CM | POA: Diagnosis not present

## 2019-04-03 DIAGNOSIS — R590 Localized enlarged lymph nodes: Secondary | ICD-10-CM | POA: Diagnosis not present

## 2019-04-03 DIAGNOSIS — C9110 Chronic lymphocytic leukemia of B-cell type not having achieved remission: Secondary | ICD-10-CM | POA: Diagnosis not present

## 2019-04-03 DIAGNOSIS — M545 Low back pain: Secondary | ICD-10-CM | POA: Diagnosis not present

## 2019-04-03 DIAGNOSIS — K5909 Other constipation: Secondary | ICD-10-CM | POA: Diagnosis not present

## 2019-04-03 DIAGNOSIS — R32 Unspecified urinary incontinence: Secondary | ICD-10-CM | POA: Diagnosis not present

## 2019-04-06 DIAGNOSIS — C9110 Chronic lymphocytic leukemia of B-cell type not having achieved remission: Secondary | ICD-10-CM | POA: Diagnosis not present

## 2019-04-06 DIAGNOSIS — R32 Unspecified urinary incontinence: Secondary | ICD-10-CM | POA: Diagnosis not present

## 2019-04-06 DIAGNOSIS — M545 Low back pain: Secondary | ICD-10-CM | POA: Diagnosis not present

## 2019-04-06 DIAGNOSIS — R590 Localized enlarged lymph nodes: Secondary | ICD-10-CM | POA: Diagnosis not present

## 2019-04-06 DIAGNOSIS — K5909 Other constipation: Secondary | ICD-10-CM | POA: Diagnosis not present

## 2019-04-06 DIAGNOSIS — G309 Alzheimer's disease, unspecified: Secondary | ICD-10-CM | POA: Diagnosis not present

## 2019-04-09 DIAGNOSIS — R32 Unspecified urinary incontinence: Secondary | ICD-10-CM | POA: Diagnosis not present

## 2019-04-09 DIAGNOSIS — G309 Alzheimer's disease, unspecified: Secondary | ICD-10-CM | POA: Diagnosis not present

## 2019-04-09 DIAGNOSIS — R590 Localized enlarged lymph nodes: Secondary | ICD-10-CM | POA: Diagnosis not present

## 2019-04-09 DIAGNOSIS — M545 Low back pain: Secondary | ICD-10-CM | POA: Diagnosis not present

## 2019-04-09 DIAGNOSIS — K5909 Other constipation: Secondary | ICD-10-CM | POA: Diagnosis not present

## 2019-04-09 DIAGNOSIS — C9110 Chronic lymphocytic leukemia of B-cell type not having achieved remission: Secondary | ICD-10-CM | POA: Diagnosis not present

## 2019-04-10 DIAGNOSIS — R32 Unspecified urinary incontinence: Secondary | ICD-10-CM | POA: Diagnosis not present

## 2019-04-10 DIAGNOSIS — K5909 Other constipation: Secondary | ICD-10-CM | POA: Diagnosis not present

## 2019-04-10 DIAGNOSIS — M545 Low back pain: Secondary | ICD-10-CM | POA: Diagnosis not present

## 2019-04-10 DIAGNOSIS — C9110 Chronic lymphocytic leukemia of B-cell type not having achieved remission: Secondary | ICD-10-CM | POA: Diagnosis not present

## 2019-04-10 DIAGNOSIS — R590 Localized enlarged lymph nodes: Secondary | ICD-10-CM | POA: Diagnosis not present

## 2019-04-10 DIAGNOSIS — G309 Alzheimer's disease, unspecified: Secondary | ICD-10-CM | POA: Diagnosis not present

## 2019-04-11 DIAGNOSIS — R590 Localized enlarged lymph nodes: Secondary | ICD-10-CM | POA: Diagnosis not present

## 2019-04-11 DIAGNOSIS — G309 Alzheimer's disease, unspecified: Secondary | ICD-10-CM | POA: Diagnosis not present

## 2019-04-11 DIAGNOSIS — C9110 Chronic lymphocytic leukemia of B-cell type not having achieved remission: Secondary | ICD-10-CM | POA: Diagnosis not present

## 2019-04-11 DIAGNOSIS — K5909 Other constipation: Secondary | ICD-10-CM | POA: Diagnosis not present

## 2019-04-11 DIAGNOSIS — R32 Unspecified urinary incontinence: Secondary | ICD-10-CM | POA: Diagnosis not present

## 2019-04-11 DIAGNOSIS — M545 Low back pain: Secondary | ICD-10-CM | POA: Diagnosis not present

## 2019-04-13 DIAGNOSIS — K5909 Other constipation: Secondary | ICD-10-CM | POA: Diagnosis not present

## 2019-04-13 DIAGNOSIS — C9110 Chronic lymphocytic leukemia of B-cell type not having achieved remission: Secondary | ICD-10-CM | POA: Diagnosis not present

## 2019-04-13 DIAGNOSIS — R590 Localized enlarged lymph nodes: Secondary | ICD-10-CM | POA: Diagnosis not present

## 2019-04-13 DIAGNOSIS — R32 Unspecified urinary incontinence: Secondary | ICD-10-CM | POA: Diagnosis not present

## 2019-04-13 DIAGNOSIS — G309 Alzheimer's disease, unspecified: Secondary | ICD-10-CM | POA: Diagnosis not present

## 2019-04-13 DIAGNOSIS — M545 Low back pain: Secondary | ICD-10-CM | POA: Diagnosis not present

## 2019-04-17 DIAGNOSIS — M545 Low back pain: Secondary | ICD-10-CM | POA: Diagnosis not present

## 2019-04-17 DIAGNOSIS — R590 Localized enlarged lymph nodes: Secondary | ICD-10-CM | POA: Diagnosis not present

## 2019-04-17 DIAGNOSIS — C9110 Chronic lymphocytic leukemia of B-cell type not having achieved remission: Secondary | ICD-10-CM | POA: Diagnosis not present

## 2019-04-17 DIAGNOSIS — R32 Unspecified urinary incontinence: Secondary | ICD-10-CM | POA: Diagnosis not present

## 2019-04-17 DIAGNOSIS — G309 Alzheimer's disease, unspecified: Secondary | ICD-10-CM | POA: Diagnosis not present

## 2019-04-17 DIAGNOSIS — K5909 Other constipation: Secondary | ICD-10-CM | POA: Diagnosis not present

## 2019-04-18 DIAGNOSIS — G309 Alzheimer's disease, unspecified: Secondary | ICD-10-CM | POA: Diagnosis not present

## 2019-04-18 DIAGNOSIS — K5909 Other constipation: Secondary | ICD-10-CM | POA: Diagnosis not present

## 2019-04-18 DIAGNOSIS — R590 Localized enlarged lymph nodes: Secondary | ICD-10-CM | POA: Diagnosis not present

## 2019-04-18 DIAGNOSIS — C9110 Chronic lymphocytic leukemia of B-cell type not having achieved remission: Secondary | ICD-10-CM | POA: Diagnosis not present

## 2019-04-18 DIAGNOSIS — R32 Unspecified urinary incontinence: Secondary | ICD-10-CM | POA: Diagnosis not present

## 2019-04-18 DIAGNOSIS — M545 Low back pain: Secondary | ICD-10-CM | POA: Diagnosis not present

## 2019-04-20 DIAGNOSIS — M545 Low back pain: Secondary | ICD-10-CM | POA: Diagnosis not present

## 2019-04-20 DIAGNOSIS — G309 Alzheimer's disease, unspecified: Secondary | ICD-10-CM | POA: Diagnosis not present

## 2019-04-20 DIAGNOSIS — K5909 Other constipation: Secondary | ICD-10-CM | POA: Diagnosis not present

## 2019-04-20 DIAGNOSIS — R590 Localized enlarged lymph nodes: Secondary | ICD-10-CM | POA: Diagnosis not present

## 2019-04-20 DIAGNOSIS — C9110 Chronic lymphocytic leukemia of B-cell type not having achieved remission: Secondary | ICD-10-CM | POA: Diagnosis not present

## 2019-04-20 DIAGNOSIS — R32 Unspecified urinary incontinence: Secondary | ICD-10-CM | POA: Diagnosis not present

## 2019-04-23 DIAGNOSIS — I10 Essential (primary) hypertension: Secondary | ICD-10-CM | POA: Diagnosis not present

## 2019-04-23 DIAGNOSIS — E119 Type 2 diabetes mellitus without complications: Secondary | ICD-10-CM | POA: Diagnosis not present

## 2019-04-23 DIAGNOSIS — R160 Hepatomegaly, not elsewhere classified: Secondary | ICD-10-CM | POA: Diagnosis not present

## 2019-04-23 DIAGNOSIS — M109 Gout, unspecified: Secondary | ICD-10-CM | POA: Diagnosis not present

## 2019-04-23 DIAGNOSIS — B356 Tinea cruris: Secondary | ICD-10-CM | POA: Diagnosis not present

## 2019-04-23 DIAGNOSIS — Z9119 Patient's noncompliance with other medical treatment and regimen: Secondary | ICD-10-CM | POA: Diagnosis not present

## 2019-04-23 DIAGNOSIS — R197 Diarrhea, unspecified: Secondary | ICD-10-CM | POA: Diagnosis not present

## 2019-04-23 DIAGNOSIS — C9110 Chronic lymphocytic leukemia of B-cell type not having achieved remission: Secondary | ICD-10-CM | POA: Diagnosis not present

## 2019-04-23 DIAGNOSIS — K5909 Other constipation: Secondary | ICD-10-CM | POA: Diagnosis not present

## 2019-04-23 DIAGNOSIS — E7800 Pure hypercholesterolemia, unspecified: Secondary | ICD-10-CM | POA: Diagnosis not present

## 2019-04-23 DIAGNOSIS — E559 Vitamin D deficiency, unspecified: Secondary | ICD-10-CM | POA: Diagnosis not present

## 2019-04-23 DIAGNOSIS — F418 Other specified anxiety disorders: Secondary | ICD-10-CM | POA: Diagnosis not present

## 2019-04-23 DIAGNOSIS — Z87440 Personal history of urinary (tract) infections: Secondary | ICD-10-CM | POA: Diagnosis not present

## 2019-04-23 DIAGNOSIS — R32 Unspecified urinary incontinence: Secondary | ICD-10-CM | POA: Diagnosis not present

## 2019-04-23 DIAGNOSIS — M545 Low back pain: Secondary | ICD-10-CM | POA: Diagnosis not present

## 2019-04-23 DIAGNOSIS — D649 Anemia, unspecified: Secondary | ICD-10-CM | POA: Diagnosis not present

## 2019-04-23 DIAGNOSIS — G309 Alzheimer's disease, unspecified: Secondary | ICD-10-CM | POA: Diagnosis not present

## 2019-04-23 DIAGNOSIS — R590 Localized enlarged lymph nodes: Secondary | ICD-10-CM | POA: Diagnosis not present

## 2019-04-24 DIAGNOSIS — C9110 Chronic lymphocytic leukemia of B-cell type not having achieved remission: Secondary | ICD-10-CM | POA: Diagnosis not present

## 2019-04-24 DIAGNOSIS — R32 Unspecified urinary incontinence: Secondary | ICD-10-CM | POA: Diagnosis not present

## 2019-04-24 DIAGNOSIS — K5909 Other constipation: Secondary | ICD-10-CM | POA: Diagnosis not present

## 2019-04-24 DIAGNOSIS — R590 Localized enlarged lymph nodes: Secondary | ICD-10-CM | POA: Diagnosis not present

## 2019-04-24 DIAGNOSIS — M545 Low back pain: Secondary | ICD-10-CM | POA: Diagnosis not present

## 2019-04-24 DIAGNOSIS — G309 Alzheimer's disease, unspecified: Secondary | ICD-10-CM | POA: Diagnosis not present

## 2019-04-25 DIAGNOSIS — G309 Alzheimer's disease, unspecified: Secondary | ICD-10-CM | POA: Diagnosis not present

## 2019-04-25 DIAGNOSIS — R590 Localized enlarged lymph nodes: Secondary | ICD-10-CM | POA: Diagnosis not present

## 2019-04-25 DIAGNOSIS — R32 Unspecified urinary incontinence: Secondary | ICD-10-CM | POA: Diagnosis not present

## 2019-04-25 DIAGNOSIS — C9110 Chronic lymphocytic leukemia of B-cell type not having achieved remission: Secondary | ICD-10-CM | POA: Diagnosis not present

## 2019-04-25 DIAGNOSIS — K5909 Other constipation: Secondary | ICD-10-CM | POA: Diagnosis not present

## 2019-04-25 DIAGNOSIS — M545 Low back pain: Secondary | ICD-10-CM | POA: Diagnosis not present

## 2019-04-27 DIAGNOSIS — K5909 Other constipation: Secondary | ICD-10-CM | POA: Diagnosis not present

## 2019-04-27 DIAGNOSIS — G309 Alzheimer's disease, unspecified: Secondary | ICD-10-CM | POA: Diagnosis not present

## 2019-04-27 DIAGNOSIS — M545 Low back pain: Secondary | ICD-10-CM | POA: Diagnosis not present

## 2019-04-27 DIAGNOSIS — C9110 Chronic lymphocytic leukemia of B-cell type not having achieved remission: Secondary | ICD-10-CM | POA: Diagnosis not present

## 2019-04-27 DIAGNOSIS — R32 Unspecified urinary incontinence: Secondary | ICD-10-CM | POA: Diagnosis not present

## 2019-04-27 DIAGNOSIS — R590 Localized enlarged lymph nodes: Secondary | ICD-10-CM | POA: Diagnosis not present

## 2019-04-30 DIAGNOSIS — C9110 Chronic lymphocytic leukemia of B-cell type not having achieved remission: Secondary | ICD-10-CM | POA: Diagnosis not present

## 2019-04-30 DIAGNOSIS — R590 Localized enlarged lymph nodes: Secondary | ICD-10-CM | POA: Diagnosis not present

## 2019-04-30 DIAGNOSIS — R32 Unspecified urinary incontinence: Secondary | ICD-10-CM | POA: Diagnosis not present

## 2019-04-30 DIAGNOSIS — K5909 Other constipation: Secondary | ICD-10-CM | POA: Diagnosis not present

## 2019-04-30 DIAGNOSIS — G309 Alzheimer's disease, unspecified: Secondary | ICD-10-CM | POA: Diagnosis not present

## 2019-04-30 DIAGNOSIS — M545 Low back pain: Secondary | ICD-10-CM | POA: Diagnosis not present

## 2019-05-01 DIAGNOSIS — C9110 Chronic lymphocytic leukemia of B-cell type not having achieved remission: Secondary | ICD-10-CM | POA: Diagnosis not present

## 2019-05-01 DIAGNOSIS — M545 Low back pain: Secondary | ICD-10-CM | POA: Diagnosis not present

## 2019-05-01 DIAGNOSIS — K5909 Other constipation: Secondary | ICD-10-CM | POA: Diagnosis not present

## 2019-05-01 DIAGNOSIS — R590 Localized enlarged lymph nodes: Secondary | ICD-10-CM | POA: Diagnosis not present

## 2019-05-01 DIAGNOSIS — G309 Alzheimer's disease, unspecified: Secondary | ICD-10-CM | POA: Diagnosis not present

## 2019-05-01 DIAGNOSIS — R32 Unspecified urinary incontinence: Secondary | ICD-10-CM | POA: Diagnosis not present

## 2019-05-04 DIAGNOSIS — R590 Localized enlarged lymph nodes: Secondary | ICD-10-CM | POA: Diagnosis not present

## 2019-05-04 DIAGNOSIS — M545 Low back pain: Secondary | ICD-10-CM | POA: Diagnosis not present

## 2019-05-04 DIAGNOSIS — R32 Unspecified urinary incontinence: Secondary | ICD-10-CM | POA: Diagnosis not present

## 2019-05-04 DIAGNOSIS — C9110 Chronic lymphocytic leukemia of B-cell type not having achieved remission: Secondary | ICD-10-CM | POA: Diagnosis not present

## 2019-05-04 DIAGNOSIS — K5909 Other constipation: Secondary | ICD-10-CM | POA: Diagnosis not present

## 2019-05-04 DIAGNOSIS — G309 Alzheimer's disease, unspecified: Secondary | ICD-10-CM | POA: Diagnosis not present

## 2019-05-07 DIAGNOSIS — M545 Low back pain: Secondary | ICD-10-CM | POA: Diagnosis not present

## 2019-05-07 DIAGNOSIS — R32 Unspecified urinary incontinence: Secondary | ICD-10-CM | POA: Diagnosis not present

## 2019-05-07 DIAGNOSIS — K5909 Other constipation: Secondary | ICD-10-CM | POA: Diagnosis not present

## 2019-05-07 DIAGNOSIS — G309 Alzheimer's disease, unspecified: Secondary | ICD-10-CM | POA: Diagnosis not present

## 2019-05-07 DIAGNOSIS — R590 Localized enlarged lymph nodes: Secondary | ICD-10-CM | POA: Diagnosis not present

## 2019-05-07 DIAGNOSIS — C9110 Chronic lymphocytic leukemia of B-cell type not having achieved remission: Secondary | ICD-10-CM | POA: Diagnosis not present

## 2019-05-08 DIAGNOSIS — G309 Alzheimer's disease, unspecified: Secondary | ICD-10-CM | POA: Diagnosis not present

## 2019-05-08 DIAGNOSIS — M545 Low back pain: Secondary | ICD-10-CM | POA: Diagnosis not present

## 2019-05-08 DIAGNOSIS — C9110 Chronic lymphocytic leukemia of B-cell type not having achieved remission: Secondary | ICD-10-CM | POA: Diagnosis not present

## 2019-05-08 DIAGNOSIS — K5909 Other constipation: Secondary | ICD-10-CM | POA: Diagnosis not present

## 2019-05-08 DIAGNOSIS — R32 Unspecified urinary incontinence: Secondary | ICD-10-CM | POA: Diagnosis not present

## 2019-05-08 DIAGNOSIS — R590 Localized enlarged lymph nodes: Secondary | ICD-10-CM | POA: Diagnosis not present

## 2019-05-09 DIAGNOSIS — C9110 Chronic lymphocytic leukemia of B-cell type not having achieved remission: Secondary | ICD-10-CM | POA: Diagnosis not present

## 2019-05-09 DIAGNOSIS — G309 Alzheimer's disease, unspecified: Secondary | ICD-10-CM | POA: Diagnosis not present

## 2019-05-09 DIAGNOSIS — M545 Low back pain: Secondary | ICD-10-CM | POA: Diagnosis not present

## 2019-05-09 DIAGNOSIS — R590 Localized enlarged lymph nodes: Secondary | ICD-10-CM | POA: Diagnosis not present

## 2019-05-09 DIAGNOSIS — K5909 Other constipation: Secondary | ICD-10-CM | POA: Diagnosis not present

## 2019-05-09 DIAGNOSIS — R32 Unspecified urinary incontinence: Secondary | ICD-10-CM | POA: Diagnosis not present

## 2019-05-11 DIAGNOSIS — R32 Unspecified urinary incontinence: Secondary | ICD-10-CM | POA: Diagnosis not present

## 2019-05-11 DIAGNOSIS — M545 Low back pain: Secondary | ICD-10-CM | POA: Diagnosis not present

## 2019-05-11 DIAGNOSIS — C9110 Chronic lymphocytic leukemia of B-cell type not having achieved remission: Secondary | ICD-10-CM | POA: Diagnosis not present

## 2019-05-11 DIAGNOSIS — G309 Alzheimer's disease, unspecified: Secondary | ICD-10-CM | POA: Diagnosis not present

## 2019-05-11 DIAGNOSIS — K5909 Other constipation: Secondary | ICD-10-CM | POA: Diagnosis not present

## 2019-05-11 DIAGNOSIS — R590 Localized enlarged lymph nodes: Secondary | ICD-10-CM | POA: Diagnosis not present

## 2019-05-14 DIAGNOSIS — M545 Low back pain: Secondary | ICD-10-CM | POA: Diagnosis not present

## 2019-05-14 DIAGNOSIS — R590 Localized enlarged lymph nodes: Secondary | ICD-10-CM | POA: Diagnosis not present

## 2019-05-14 DIAGNOSIS — R32 Unspecified urinary incontinence: Secondary | ICD-10-CM | POA: Diagnosis not present

## 2019-05-14 DIAGNOSIS — K5909 Other constipation: Secondary | ICD-10-CM | POA: Diagnosis not present

## 2019-05-14 DIAGNOSIS — C9110 Chronic lymphocytic leukemia of B-cell type not having achieved remission: Secondary | ICD-10-CM | POA: Diagnosis not present

## 2019-05-14 DIAGNOSIS — G309 Alzheimer's disease, unspecified: Secondary | ICD-10-CM | POA: Diagnosis not present

## 2019-05-16 DIAGNOSIS — M545 Low back pain: Secondary | ICD-10-CM | POA: Diagnosis not present

## 2019-05-16 DIAGNOSIS — R32 Unspecified urinary incontinence: Secondary | ICD-10-CM | POA: Diagnosis not present

## 2019-05-16 DIAGNOSIS — G309 Alzheimer's disease, unspecified: Secondary | ICD-10-CM | POA: Diagnosis not present

## 2019-05-16 DIAGNOSIS — K5909 Other constipation: Secondary | ICD-10-CM | POA: Diagnosis not present

## 2019-05-16 DIAGNOSIS — C9110 Chronic lymphocytic leukemia of B-cell type not having achieved remission: Secondary | ICD-10-CM | POA: Diagnosis not present

## 2019-05-16 DIAGNOSIS — R590 Localized enlarged lymph nodes: Secondary | ICD-10-CM | POA: Diagnosis not present

## 2019-05-18 DIAGNOSIS — R590 Localized enlarged lymph nodes: Secondary | ICD-10-CM | POA: Diagnosis not present

## 2019-05-18 DIAGNOSIS — G309 Alzheimer's disease, unspecified: Secondary | ICD-10-CM | POA: Diagnosis not present

## 2019-05-18 DIAGNOSIS — M545 Low back pain: Secondary | ICD-10-CM | POA: Diagnosis not present

## 2019-05-18 DIAGNOSIS — R32 Unspecified urinary incontinence: Secondary | ICD-10-CM | POA: Diagnosis not present

## 2019-05-18 DIAGNOSIS — K5909 Other constipation: Secondary | ICD-10-CM | POA: Diagnosis not present

## 2019-05-18 DIAGNOSIS — C9110 Chronic lymphocytic leukemia of B-cell type not having achieved remission: Secondary | ICD-10-CM | POA: Diagnosis not present

## 2019-05-21 DIAGNOSIS — G309 Alzheimer's disease, unspecified: Secondary | ICD-10-CM | POA: Diagnosis not present

## 2019-05-21 DIAGNOSIS — R32 Unspecified urinary incontinence: Secondary | ICD-10-CM | POA: Diagnosis not present

## 2019-05-21 DIAGNOSIS — K5909 Other constipation: Secondary | ICD-10-CM | POA: Diagnosis not present

## 2019-05-21 DIAGNOSIS — R590 Localized enlarged lymph nodes: Secondary | ICD-10-CM | POA: Diagnosis not present

## 2019-05-21 DIAGNOSIS — C9110 Chronic lymphocytic leukemia of B-cell type not having achieved remission: Secondary | ICD-10-CM | POA: Diagnosis not present

## 2019-05-21 DIAGNOSIS — M545 Low back pain: Secondary | ICD-10-CM | POA: Diagnosis not present

## 2019-05-22 DIAGNOSIS — K5909 Other constipation: Secondary | ICD-10-CM | POA: Diagnosis not present

## 2019-05-22 DIAGNOSIS — R32 Unspecified urinary incontinence: Secondary | ICD-10-CM | POA: Diagnosis not present

## 2019-05-22 DIAGNOSIS — G309 Alzheimer's disease, unspecified: Secondary | ICD-10-CM | POA: Diagnosis not present

## 2019-05-22 DIAGNOSIS — M545 Low back pain: Secondary | ICD-10-CM | POA: Diagnosis not present

## 2019-05-22 DIAGNOSIS — R590 Localized enlarged lymph nodes: Secondary | ICD-10-CM | POA: Diagnosis not present

## 2019-05-22 DIAGNOSIS — C9110 Chronic lymphocytic leukemia of B-cell type not having achieved remission: Secondary | ICD-10-CM | POA: Diagnosis not present

## 2019-05-23 DIAGNOSIS — D649 Anemia, unspecified: Secondary | ICD-10-CM | POA: Diagnosis not present

## 2019-05-23 DIAGNOSIS — C9110 Chronic lymphocytic leukemia of B-cell type not having achieved remission: Secondary | ICD-10-CM | POA: Diagnosis not present

## 2019-05-23 DIAGNOSIS — Z87440 Personal history of urinary (tract) infections: Secondary | ICD-10-CM | POA: Diagnosis not present

## 2019-05-23 DIAGNOSIS — R160 Hepatomegaly, not elsewhere classified: Secondary | ICD-10-CM | POA: Diagnosis not present

## 2019-05-23 DIAGNOSIS — R32 Unspecified urinary incontinence: Secondary | ICD-10-CM | POA: Diagnosis not present

## 2019-05-23 DIAGNOSIS — E119 Type 2 diabetes mellitus without complications: Secondary | ICD-10-CM | POA: Diagnosis not present

## 2019-05-23 DIAGNOSIS — I10 Essential (primary) hypertension: Secondary | ICD-10-CM | POA: Diagnosis not present

## 2019-05-23 DIAGNOSIS — R590 Localized enlarged lymph nodes: Secondary | ICD-10-CM | POA: Diagnosis not present

## 2019-05-23 DIAGNOSIS — K5909 Other constipation: Secondary | ICD-10-CM | POA: Diagnosis not present

## 2019-05-23 DIAGNOSIS — E559 Vitamin D deficiency, unspecified: Secondary | ICD-10-CM | POA: Diagnosis not present

## 2019-05-23 DIAGNOSIS — B356 Tinea cruris: Secondary | ICD-10-CM | POA: Diagnosis not present

## 2019-05-23 DIAGNOSIS — M109 Gout, unspecified: Secondary | ICD-10-CM | POA: Diagnosis not present

## 2019-05-23 DIAGNOSIS — Z9119 Patient's noncompliance with other medical treatment and regimen: Secondary | ICD-10-CM | POA: Diagnosis not present

## 2019-05-23 DIAGNOSIS — M545 Low back pain: Secondary | ICD-10-CM | POA: Diagnosis not present

## 2019-05-23 DIAGNOSIS — R197 Diarrhea, unspecified: Secondary | ICD-10-CM | POA: Diagnosis not present

## 2019-05-23 DIAGNOSIS — E7800 Pure hypercholesterolemia, unspecified: Secondary | ICD-10-CM | POA: Diagnosis not present

## 2019-05-23 DIAGNOSIS — G309 Alzheimer's disease, unspecified: Secondary | ICD-10-CM | POA: Diagnosis not present

## 2019-05-23 DIAGNOSIS — F418 Other specified anxiety disorders: Secondary | ICD-10-CM | POA: Diagnosis not present

## 2019-05-24 DIAGNOSIS — R32 Unspecified urinary incontinence: Secondary | ICD-10-CM | POA: Diagnosis not present

## 2019-05-24 DIAGNOSIS — C9110 Chronic lymphocytic leukemia of B-cell type not having achieved remission: Secondary | ICD-10-CM | POA: Diagnosis not present

## 2019-05-24 DIAGNOSIS — M545 Low back pain: Secondary | ICD-10-CM | POA: Diagnosis not present

## 2019-05-24 DIAGNOSIS — R590 Localized enlarged lymph nodes: Secondary | ICD-10-CM | POA: Diagnosis not present

## 2019-05-24 DIAGNOSIS — K5909 Other constipation: Secondary | ICD-10-CM | POA: Diagnosis not present

## 2019-05-24 DIAGNOSIS — G309 Alzheimer's disease, unspecified: Secondary | ICD-10-CM | POA: Diagnosis not present

## 2019-05-28 DIAGNOSIS — M545 Low back pain: Secondary | ICD-10-CM | POA: Diagnosis not present

## 2019-05-28 DIAGNOSIS — C9110 Chronic lymphocytic leukemia of B-cell type not having achieved remission: Secondary | ICD-10-CM | POA: Diagnosis not present

## 2019-05-28 DIAGNOSIS — G309 Alzheimer's disease, unspecified: Secondary | ICD-10-CM | POA: Diagnosis not present

## 2019-05-28 DIAGNOSIS — R590 Localized enlarged lymph nodes: Secondary | ICD-10-CM | POA: Diagnosis not present

## 2019-05-28 DIAGNOSIS — R32 Unspecified urinary incontinence: Secondary | ICD-10-CM | POA: Diagnosis not present

## 2019-05-28 DIAGNOSIS — K5909 Other constipation: Secondary | ICD-10-CM | POA: Diagnosis not present

## 2019-05-29 DIAGNOSIS — G309 Alzheimer's disease, unspecified: Secondary | ICD-10-CM | POA: Diagnosis not present

## 2019-05-29 DIAGNOSIS — C9110 Chronic lymphocytic leukemia of B-cell type not having achieved remission: Secondary | ICD-10-CM | POA: Diagnosis not present

## 2019-05-29 DIAGNOSIS — K5909 Other constipation: Secondary | ICD-10-CM | POA: Diagnosis not present

## 2019-05-29 DIAGNOSIS — M545 Low back pain: Secondary | ICD-10-CM | POA: Diagnosis not present

## 2019-05-29 DIAGNOSIS — R590 Localized enlarged lymph nodes: Secondary | ICD-10-CM | POA: Diagnosis not present

## 2019-05-29 DIAGNOSIS — R32 Unspecified urinary incontinence: Secondary | ICD-10-CM | POA: Diagnosis not present

## 2019-05-30 DIAGNOSIS — C9110 Chronic lymphocytic leukemia of B-cell type not having achieved remission: Secondary | ICD-10-CM | POA: Diagnosis not present

## 2019-05-30 DIAGNOSIS — K5909 Other constipation: Secondary | ICD-10-CM | POA: Diagnosis not present

## 2019-05-30 DIAGNOSIS — R590 Localized enlarged lymph nodes: Secondary | ICD-10-CM | POA: Diagnosis not present

## 2019-05-30 DIAGNOSIS — R32 Unspecified urinary incontinence: Secondary | ICD-10-CM | POA: Diagnosis not present

## 2019-05-30 DIAGNOSIS — M545 Low back pain: Secondary | ICD-10-CM | POA: Diagnosis not present

## 2019-05-30 DIAGNOSIS — G309 Alzheimer's disease, unspecified: Secondary | ICD-10-CM | POA: Diagnosis not present

## 2019-06-01 DIAGNOSIS — M545 Low back pain: Secondary | ICD-10-CM | POA: Diagnosis not present

## 2019-06-01 DIAGNOSIS — C9110 Chronic lymphocytic leukemia of B-cell type not having achieved remission: Secondary | ICD-10-CM | POA: Diagnosis not present

## 2019-06-01 DIAGNOSIS — R590 Localized enlarged lymph nodes: Secondary | ICD-10-CM | POA: Diagnosis not present

## 2019-06-01 DIAGNOSIS — K5909 Other constipation: Secondary | ICD-10-CM | POA: Diagnosis not present

## 2019-06-01 DIAGNOSIS — R32 Unspecified urinary incontinence: Secondary | ICD-10-CM | POA: Diagnosis not present

## 2019-06-01 DIAGNOSIS — G309 Alzheimer's disease, unspecified: Secondary | ICD-10-CM | POA: Diagnosis not present

## 2019-06-04 DIAGNOSIS — C9110 Chronic lymphocytic leukemia of B-cell type not having achieved remission: Secondary | ICD-10-CM | POA: Diagnosis not present

## 2019-06-04 DIAGNOSIS — K5909 Other constipation: Secondary | ICD-10-CM | POA: Diagnosis not present

## 2019-06-04 DIAGNOSIS — G309 Alzheimer's disease, unspecified: Secondary | ICD-10-CM | POA: Diagnosis not present

## 2019-06-04 DIAGNOSIS — R32 Unspecified urinary incontinence: Secondary | ICD-10-CM | POA: Diagnosis not present

## 2019-06-04 DIAGNOSIS — M545 Low back pain: Secondary | ICD-10-CM | POA: Diagnosis not present

## 2019-06-04 DIAGNOSIS — R590 Localized enlarged lymph nodes: Secondary | ICD-10-CM | POA: Diagnosis not present

## 2019-06-05 DIAGNOSIS — R590 Localized enlarged lymph nodes: Secondary | ICD-10-CM | POA: Diagnosis not present

## 2019-06-05 DIAGNOSIS — C9110 Chronic lymphocytic leukemia of B-cell type not having achieved remission: Secondary | ICD-10-CM | POA: Diagnosis not present

## 2019-06-05 DIAGNOSIS — G309 Alzheimer's disease, unspecified: Secondary | ICD-10-CM | POA: Diagnosis not present

## 2019-06-05 DIAGNOSIS — R32 Unspecified urinary incontinence: Secondary | ICD-10-CM | POA: Diagnosis not present

## 2019-06-05 DIAGNOSIS — M545 Low back pain: Secondary | ICD-10-CM | POA: Diagnosis not present

## 2019-06-05 DIAGNOSIS — K5909 Other constipation: Secondary | ICD-10-CM | POA: Diagnosis not present

## 2019-06-06 DIAGNOSIS — C9110 Chronic lymphocytic leukemia of B-cell type not having achieved remission: Secondary | ICD-10-CM | POA: Diagnosis not present

## 2019-06-06 DIAGNOSIS — M545 Low back pain: Secondary | ICD-10-CM | POA: Diagnosis not present

## 2019-06-06 DIAGNOSIS — R590 Localized enlarged lymph nodes: Secondary | ICD-10-CM | POA: Diagnosis not present

## 2019-06-06 DIAGNOSIS — G309 Alzheimer's disease, unspecified: Secondary | ICD-10-CM | POA: Diagnosis not present

## 2019-06-06 DIAGNOSIS — K5909 Other constipation: Secondary | ICD-10-CM | POA: Diagnosis not present

## 2019-06-06 DIAGNOSIS — R32 Unspecified urinary incontinence: Secondary | ICD-10-CM | POA: Diagnosis not present

## 2019-06-08 DIAGNOSIS — R590 Localized enlarged lymph nodes: Secondary | ICD-10-CM | POA: Diagnosis not present

## 2019-06-08 DIAGNOSIS — G309 Alzheimer's disease, unspecified: Secondary | ICD-10-CM | POA: Diagnosis not present

## 2019-06-08 DIAGNOSIS — K5909 Other constipation: Secondary | ICD-10-CM | POA: Diagnosis not present

## 2019-06-08 DIAGNOSIS — M545 Low back pain: Secondary | ICD-10-CM | POA: Diagnosis not present

## 2019-06-08 DIAGNOSIS — R32 Unspecified urinary incontinence: Secondary | ICD-10-CM | POA: Diagnosis not present

## 2019-06-08 DIAGNOSIS — C9110 Chronic lymphocytic leukemia of B-cell type not having achieved remission: Secondary | ICD-10-CM | POA: Diagnosis not present

## 2019-06-11 DIAGNOSIS — C9110 Chronic lymphocytic leukemia of B-cell type not having achieved remission: Secondary | ICD-10-CM | POA: Diagnosis not present

## 2019-06-11 DIAGNOSIS — M545 Low back pain: Secondary | ICD-10-CM | POA: Diagnosis not present

## 2019-06-11 DIAGNOSIS — R32 Unspecified urinary incontinence: Secondary | ICD-10-CM | POA: Diagnosis not present

## 2019-06-11 DIAGNOSIS — K5909 Other constipation: Secondary | ICD-10-CM | POA: Diagnosis not present

## 2019-06-11 DIAGNOSIS — R590 Localized enlarged lymph nodes: Secondary | ICD-10-CM | POA: Diagnosis not present

## 2019-06-11 DIAGNOSIS — G309 Alzheimer's disease, unspecified: Secondary | ICD-10-CM | POA: Diagnosis not present

## 2019-06-12 DIAGNOSIS — C9110 Chronic lymphocytic leukemia of B-cell type not having achieved remission: Secondary | ICD-10-CM | POA: Diagnosis not present

## 2019-06-12 DIAGNOSIS — G309 Alzheimer's disease, unspecified: Secondary | ICD-10-CM | POA: Diagnosis not present

## 2019-06-12 DIAGNOSIS — R32 Unspecified urinary incontinence: Secondary | ICD-10-CM | POA: Diagnosis not present

## 2019-06-12 DIAGNOSIS — R590 Localized enlarged lymph nodes: Secondary | ICD-10-CM | POA: Diagnosis not present

## 2019-06-12 DIAGNOSIS — K5909 Other constipation: Secondary | ICD-10-CM | POA: Diagnosis not present

## 2019-06-12 DIAGNOSIS — M545 Low back pain: Secondary | ICD-10-CM | POA: Diagnosis not present

## 2019-06-15 DIAGNOSIS — R590 Localized enlarged lymph nodes: Secondary | ICD-10-CM | POA: Diagnosis not present

## 2019-06-15 DIAGNOSIS — G309 Alzheimer's disease, unspecified: Secondary | ICD-10-CM | POA: Diagnosis not present

## 2019-06-15 DIAGNOSIS — M545 Low back pain: Secondary | ICD-10-CM | POA: Diagnosis not present

## 2019-06-15 DIAGNOSIS — R32 Unspecified urinary incontinence: Secondary | ICD-10-CM | POA: Diagnosis not present

## 2019-06-15 DIAGNOSIS — C9110 Chronic lymphocytic leukemia of B-cell type not having achieved remission: Secondary | ICD-10-CM | POA: Diagnosis not present

## 2019-06-15 DIAGNOSIS — K5909 Other constipation: Secondary | ICD-10-CM | POA: Diagnosis not present

## 2019-06-18 DIAGNOSIS — G309 Alzheimer's disease, unspecified: Secondary | ICD-10-CM | POA: Diagnosis not present

## 2019-06-18 DIAGNOSIS — C9110 Chronic lymphocytic leukemia of B-cell type not having achieved remission: Secondary | ICD-10-CM | POA: Diagnosis not present

## 2019-06-18 DIAGNOSIS — M545 Low back pain: Secondary | ICD-10-CM | POA: Diagnosis not present

## 2019-06-18 DIAGNOSIS — R590 Localized enlarged lymph nodes: Secondary | ICD-10-CM | POA: Diagnosis not present

## 2019-06-18 DIAGNOSIS — K5909 Other constipation: Secondary | ICD-10-CM | POA: Diagnosis not present

## 2019-06-18 DIAGNOSIS — R32 Unspecified urinary incontinence: Secondary | ICD-10-CM | POA: Diagnosis not present

## 2019-06-19 DIAGNOSIS — K5909 Other constipation: Secondary | ICD-10-CM | POA: Diagnosis not present

## 2019-06-19 DIAGNOSIS — R32 Unspecified urinary incontinence: Secondary | ICD-10-CM | POA: Diagnosis not present

## 2019-06-19 DIAGNOSIS — C9110 Chronic lymphocytic leukemia of B-cell type not having achieved remission: Secondary | ICD-10-CM | POA: Diagnosis not present

## 2019-06-19 DIAGNOSIS — G309 Alzheimer's disease, unspecified: Secondary | ICD-10-CM | POA: Diagnosis not present

## 2019-06-19 DIAGNOSIS — R590 Localized enlarged lymph nodes: Secondary | ICD-10-CM | POA: Diagnosis not present

## 2019-06-19 DIAGNOSIS — M545 Low back pain: Secondary | ICD-10-CM | POA: Diagnosis not present

## 2019-06-22 DIAGNOSIS — G309 Alzheimer's disease, unspecified: Secondary | ICD-10-CM | POA: Diagnosis not present

## 2019-06-22 DIAGNOSIS — C9110 Chronic lymphocytic leukemia of B-cell type not having achieved remission: Secondary | ICD-10-CM | POA: Diagnosis not present

## 2019-06-22 DIAGNOSIS — K5909 Other constipation: Secondary | ICD-10-CM | POA: Diagnosis not present

## 2019-06-22 DIAGNOSIS — R590 Localized enlarged lymph nodes: Secondary | ICD-10-CM | POA: Diagnosis not present

## 2019-06-22 DIAGNOSIS — R32 Unspecified urinary incontinence: Secondary | ICD-10-CM | POA: Diagnosis not present

## 2019-06-22 DIAGNOSIS — M545 Low back pain: Secondary | ICD-10-CM | POA: Diagnosis not present

## 2019-06-23 DIAGNOSIS — Z9119 Patient's noncompliance with other medical treatment and regimen: Secondary | ICD-10-CM | POA: Diagnosis not present

## 2019-06-23 DIAGNOSIS — R32 Unspecified urinary incontinence: Secondary | ICD-10-CM | POA: Diagnosis not present

## 2019-06-23 DIAGNOSIS — I10 Essential (primary) hypertension: Secondary | ICD-10-CM | POA: Diagnosis not present

## 2019-06-23 DIAGNOSIS — G309 Alzheimer's disease, unspecified: Secondary | ICD-10-CM | POA: Diagnosis not present

## 2019-06-23 DIAGNOSIS — F418 Other specified anxiety disorders: Secondary | ICD-10-CM | POA: Diagnosis not present

## 2019-06-23 DIAGNOSIS — E119 Type 2 diabetes mellitus without complications: Secondary | ICD-10-CM | POA: Diagnosis not present

## 2019-06-23 DIAGNOSIS — K5909 Other constipation: Secondary | ICD-10-CM | POA: Diagnosis not present

## 2019-06-23 DIAGNOSIS — E559 Vitamin D deficiency, unspecified: Secondary | ICD-10-CM | POA: Diagnosis not present

## 2019-06-23 DIAGNOSIS — C9110 Chronic lymphocytic leukemia of B-cell type not having achieved remission: Secondary | ICD-10-CM | POA: Diagnosis not present

## 2019-06-23 DIAGNOSIS — M545 Low back pain: Secondary | ICD-10-CM | POA: Diagnosis not present

## 2019-06-23 DIAGNOSIS — R197 Diarrhea, unspecified: Secondary | ICD-10-CM | POA: Diagnosis not present

## 2019-06-23 DIAGNOSIS — M109 Gout, unspecified: Secondary | ICD-10-CM | POA: Diagnosis not present

## 2019-06-23 DIAGNOSIS — B356 Tinea cruris: Secondary | ICD-10-CM | POA: Diagnosis not present

## 2019-06-23 DIAGNOSIS — D649 Anemia, unspecified: Secondary | ICD-10-CM | POA: Diagnosis not present

## 2019-06-23 DIAGNOSIS — R160 Hepatomegaly, not elsewhere classified: Secondary | ICD-10-CM | POA: Diagnosis not present

## 2019-06-23 DIAGNOSIS — Z87440 Personal history of urinary (tract) infections: Secondary | ICD-10-CM | POA: Diagnosis not present

## 2019-06-23 DIAGNOSIS — R590 Localized enlarged lymph nodes: Secondary | ICD-10-CM | POA: Diagnosis not present

## 2019-06-23 DIAGNOSIS — E7800 Pure hypercholesterolemia, unspecified: Secondary | ICD-10-CM | POA: Diagnosis not present

## 2019-06-25 DIAGNOSIS — G309 Alzheimer's disease, unspecified: Secondary | ICD-10-CM | POA: Diagnosis not present

## 2019-06-25 DIAGNOSIS — C9110 Chronic lymphocytic leukemia of B-cell type not having achieved remission: Secondary | ICD-10-CM | POA: Diagnosis not present

## 2019-06-25 DIAGNOSIS — R590 Localized enlarged lymph nodes: Secondary | ICD-10-CM | POA: Diagnosis not present

## 2019-06-25 DIAGNOSIS — R32 Unspecified urinary incontinence: Secondary | ICD-10-CM | POA: Diagnosis not present

## 2019-06-25 DIAGNOSIS — M545 Low back pain: Secondary | ICD-10-CM | POA: Diagnosis not present

## 2019-06-25 DIAGNOSIS — K5909 Other constipation: Secondary | ICD-10-CM | POA: Diagnosis not present

## 2019-06-26 DIAGNOSIS — K5909 Other constipation: Secondary | ICD-10-CM | POA: Diagnosis not present

## 2019-06-26 DIAGNOSIS — R32 Unspecified urinary incontinence: Secondary | ICD-10-CM | POA: Diagnosis not present

## 2019-06-26 DIAGNOSIS — R590 Localized enlarged lymph nodes: Secondary | ICD-10-CM | POA: Diagnosis not present

## 2019-06-26 DIAGNOSIS — M545 Low back pain: Secondary | ICD-10-CM | POA: Diagnosis not present

## 2019-06-26 DIAGNOSIS — G309 Alzheimer's disease, unspecified: Secondary | ICD-10-CM | POA: Diagnosis not present

## 2019-06-26 DIAGNOSIS — C9110 Chronic lymphocytic leukemia of B-cell type not having achieved remission: Secondary | ICD-10-CM | POA: Diagnosis not present

## 2019-06-29 DIAGNOSIS — C9110 Chronic lymphocytic leukemia of B-cell type not having achieved remission: Secondary | ICD-10-CM | POA: Diagnosis not present

## 2019-06-29 DIAGNOSIS — R590 Localized enlarged lymph nodes: Secondary | ICD-10-CM | POA: Diagnosis not present

## 2019-06-29 DIAGNOSIS — M545 Low back pain: Secondary | ICD-10-CM | POA: Diagnosis not present

## 2019-06-29 DIAGNOSIS — K5909 Other constipation: Secondary | ICD-10-CM | POA: Diagnosis not present

## 2019-06-29 DIAGNOSIS — R32 Unspecified urinary incontinence: Secondary | ICD-10-CM | POA: Diagnosis not present

## 2019-06-29 DIAGNOSIS — G309 Alzheimer's disease, unspecified: Secondary | ICD-10-CM | POA: Diagnosis not present

## 2019-07-02 DIAGNOSIS — G309 Alzheimer's disease, unspecified: Secondary | ICD-10-CM | POA: Diagnosis not present

## 2019-07-02 DIAGNOSIS — R590 Localized enlarged lymph nodes: Secondary | ICD-10-CM | POA: Diagnosis not present

## 2019-07-02 DIAGNOSIS — M545 Low back pain: Secondary | ICD-10-CM | POA: Diagnosis not present

## 2019-07-02 DIAGNOSIS — K5909 Other constipation: Secondary | ICD-10-CM | POA: Diagnosis not present

## 2019-07-02 DIAGNOSIS — C9110 Chronic lymphocytic leukemia of B-cell type not having achieved remission: Secondary | ICD-10-CM | POA: Diagnosis not present

## 2019-07-02 DIAGNOSIS — R32 Unspecified urinary incontinence: Secondary | ICD-10-CM | POA: Diagnosis not present

## 2019-07-03 DIAGNOSIS — M545 Low back pain: Secondary | ICD-10-CM | POA: Diagnosis not present

## 2019-07-03 DIAGNOSIS — C9110 Chronic lymphocytic leukemia of B-cell type not having achieved remission: Secondary | ICD-10-CM | POA: Diagnosis not present

## 2019-07-03 DIAGNOSIS — G309 Alzheimer's disease, unspecified: Secondary | ICD-10-CM | POA: Diagnosis not present

## 2019-07-03 DIAGNOSIS — R32 Unspecified urinary incontinence: Secondary | ICD-10-CM | POA: Diagnosis not present

## 2019-07-03 DIAGNOSIS — R590 Localized enlarged lymph nodes: Secondary | ICD-10-CM | POA: Diagnosis not present

## 2019-07-03 DIAGNOSIS — K5909 Other constipation: Secondary | ICD-10-CM | POA: Diagnosis not present

## 2019-07-04 DIAGNOSIS — G309 Alzheimer's disease, unspecified: Secondary | ICD-10-CM | POA: Diagnosis not present

## 2019-07-04 DIAGNOSIS — R590 Localized enlarged lymph nodes: Secondary | ICD-10-CM | POA: Diagnosis not present

## 2019-07-04 DIAGNOSIS — M545 Low back pain: Secondary | ICD-10-CM | POA: Diagnosis not present

## 2019-07-04 DIAGNOSIS — K5909 Other constipation: Secondary | ICD-10-CM | POA: Diagnosis not present

## 2019-07-04 DIAGNOSIS — C9110 Chronic lymphocytic leukemia of B-cell type not having achieved remission: Secondary | ICD-10-CM | POA: Diagnosis not present

## 2019-07-04 DIAGNOSIS — R32 Unspecified urinary incontinence: Secondary | ICD-10-CM | POA: Diagnosis not present

## 2019-07-06 DIAGNOSIS — M545 Low back pain: Secondary | ICD-10-CM | POA: Diagnosis not present

## 2019-07-06 DIAGNOSIS — R590 Localized enlarged lymph nodes: Secondary | ICD-10-CM | POA: Diagnosis not present

## 2019-07-06 DIAGNOSIS — C9110 Chronic lymphocytic leukemia of B-cell type not having achieved remission: Secondary | ICD-10-CM | POA: Diagnosis not present

## 2019-07-06 DIAGNOSIS — G309 Alzheimer's disease, unspecified: Secondary | ICD-10-CM | POA: Diagnosis not present

## 2019-07-06 DIAGNOSIS — R32 Unspecified urinary incontinence: Secondary | ICD-10-CM | POA: Diagnosis not present

## 2019-07-06 DIAGNOSIS — K5909 Other constipation: Secondary | ICD-10-CM | POA: Diagnosis not present

## 2019-07-09 DIAGNOSIS — R590 Localized enlarged lymph nodes: Secondary | ICD-10-CM | POA: Diagnosis not present

## 2019-07-09 DIAGNOSIS — R32 Unspecified urinary incontinence: Secondary | ICD-10-CM | POA: Diagnosis not present

## 2019-07-09 DIAGNOSIS — M545 Low back pain: Secondary | ICD-10-CM | POA: Diagnosis not present

## 2019-07-09 DIAGNOSIS — K5909 Other constipation: Secondary | ICD-10-CM | POA: Diagnosis not present

## 2019-07-09 DIAGNOSIS — C9110 Chronic lymphocytic leukemia of B-cell type not having achieved remission: Secondary | ICD-10-CM | POA: Diagnosis not present

## 2019-07-09 DIAGNOSIS — G309 Alzheimer's disease, unspecified: Secondary | ICD-10-CM | POA: Diagnosis not present

## 2019-07-10 DIAGNOSIS — M79674 Pain in right toe(s): Secondary | ICD-10-CM | POA: Diagnosis not present

## 2019-07-10 DIAGNOSIS — M545 Low back pain: Secondary | ICD-10-CM | POA: Diagnosis not present

## 2019-07-10 DIAGNOSIS — E1151 Type 2 diabetes mellitus with diabetic peripheral angiopathy without gangrene: Secondary | ICD-10-CM | POA: Diagnosis not present

## 2019-07-10 DIAGNOSIS — M2041 Other hammer toe(s) (acquired), right foot: Secondary | ICD-10-CM | POA: Diagnosis not present

## 2019-07-10 DIAGNOSIS — B351 Tinea unguium: Secondary | ICD-10-CM | POA: Diagnosis not present

## 2019-07-10 DIAGNOSIS — R590 Localized enlarged lymph nodes: Secondary | ICD-10-CM | POA: Diagnosis not present

## 2019-07-10 DIAGNOSIS — C9110 Chronic lymphocytic leukemia of B-cell type not having achieved remission: Secondary | ICD-10-CM | POA: Diagnosis not present

## 2019-07-10 DIAGNOSIS — K5909 Other constipation: Secondary | ICD-10-CM | POA: Diagnosis not present

## 2019-07-10 DIAGNOSIS — G309 Alzheimer's disease, unspecified: Secondary | ICD-10-CM | POA: Diagnosis not present

## 2019-07-10 DIAGNOSIS — R32 Unspecified urinary incontinence: Secondary | ICD-10-CM | POA: Diagnosis not present

## 2019-07-11 DIAGNOSIS — R590 Localized enlarged lymph nodes: Secondary | ICD-10-CM | POA: Diagnosis not present

## 2019-07-11 DIAGNOSIS — K5909 Other constipation: Secondary | ICD-10-CM | POA: Diagnosis not present

## 2019-07-11 DIAGNOSIS — M545 Low back pain: Secondary | ICD-10-CM | POA: Diagnosis not present

## 2019-07-11 DIAGNOSIS — G309 Alzheimer's disease, unspecified: Secondary | ICD-10-CM | POA: Diagnosis not present

## 2019-07-11 DIAGNOSIS — C9110 Chronic lymphocytic leukemia of B-cell type not having achieved remission: Secondary | ICD-10-CM | POA: Diagnosis not present

## 2019-07-11 DIAGNOSIS — R32 Unspecified urinary incontinence: Secondary | ICD-10-CM | POA: Diagnosis not present

## 2019-07-13 DIAGNOSIS — K5909 Other constipation: Secondary | ICD-10-CM | POA: Diagnosis not present

## 2019-07-13 DIAGNOSIS — R590 Localized enlarged lymph nodes: Secondary | ICD-10-CM | POA: Diagnosis not present

## 2019-07-13 DIAGNOSIS — C9110 Chronic lymphocytic leukemia of B-cell type not having achieved remission: Secondary | ICD-10-CM | POA: Diagnosis not present

## 2019-07-13 DIAGNOSIS — R32 Unspecified urinary incontinence: Secondary | ICD-10-CM | POA: Diagnosis not present

## 2019-07-13 DIAGNOSIS — M545 Low back pain: Secondary | ICD-10-CM | POA: Diagnosis not present

## 2019-07-13 DIAGNOSIS — G309 Alzheimer's disease, unspecified: Secondary | ICD-10-CM | POA: Diagnosis not present

## 2019-07-17 DIAGNOSIS — G309 Alzheimer's disease, unspecified: Secondary | ICD-10-CM | POA: Diagnosis not present

## 2019-07-17 DIAGNOSIS — C9110 Chronic lymphocytic leukemia of B-cell type not having achieved remission: Secondary | ICD-10-CM | POA: Diagnosis not present

## 2019-07-17 DIAGNOSIS — K5909 Other constipation: Secondary | ICD-10-CM | POA: Diagnosis not present

## 2019-07-17 DIAGNOSIS — R32 Unspecified urinary incontinence: Secondary | ICD-10-CM | POA: Diagnosis not present

## 2019-07-17 DIAGNOSIS — R590 Localized enlarged lymph nodes: Secondary | ICD-10-CM | POA: Diagnosis not present

## 2019-07-17 DIAGNOSIS — M545 Low back pain: Secondary | ICD-10-CM | POA: Diagnosis not present

## 2019-07-18 DIAGNOSIS — R32 Unspecified urinary incontinence: Secondary | ICD-10-CM | POA: Diagnosis not present

## 2019-07-18 DIAGNOSIS — C9110 Chronic lymphocytic leukemia of B-cell type not having achieved remission: Secondary | ICD-10-CM | POA: Diagnosis not present

## 2019-07-18 DIAGNOSIS — K5909 Other constipation: Secondary | ICD-10-CM | POA: Diagnosis not present

## 2019-07-18 DIAGNOSIS — G309 Alzheimer's disease, unspecified: Secondary | ICD-10-CM | POA: Diagnosis not present

## 2019-07-18 DIAGNOSIS — R590 Localized enlarged lymph nodes: Secondary | ICD-10-CM | POA: Diagnosis not present

## 2019-07-18 DIAGNOSIS — M545 Low back pain: Secondary | ICD-10-CM | POA: Diagnosis not present

## 2019-07-20 DIAGNOSIS — G309 Alzheimer's disease, unspecified: Secondary | ICD-10-CM | POA: Diagnosis not present

## 2019-07-20 DIAGNOSIS — R32 Unspecified urinary incontinence: Secondary | ICD-10-CM | POA: Diagnosis not present

## 2019-07-20 DIAGNOSIS — R590 Localized enlarged lymph nodes: Secondary | ICD-10-CM | POA: Diagnosis not present

## 2019-07-20 DIAGNOSIS — M545 Low back pain: Secondary | ICD-10-CM | POA: Diagnosis not present

## 2019-07-20 DIAGNOSIS — K5909 Other constipation: Secondary | ICD-10-CM | POA: Diagnosis not present

## 2019-07-20 DIAGNOSIS — C9110 Chronic lymphocytic leukemia of B-cell type not having achieved remission: Secondary | ICD-10-CM | POA: Diagnosis not present

## 2019-07-24 DIAGNOSIS — M109 Gout, unspecified: Secondary | ICD-10-CM | POA: Diagnosis not present

## 2019-07-24 DIAGNOSIS — E7800 Pure hypercholesterolemia, unspecified: Secondary | ICD-10-CM | POA: Diagnosis not present

## 2019-07-24 DIAGNOSIS — Z9119 Patient's noncompliance with other medical treatment and regimen: Secondary | ICD-10-CM | POA: Diagnosis not present

## 2019-07-24 DIAGNOSIS — R590 Localized enlarged lymph nodes: Secondary | ICD-10-CM | POA: Diagnosis not present

## 2019-07-24 DIAGNOSIS — Z87440 Personal history of urinary (tract) infections: Secondary | ICD-10-CM | POA: Diagnosis not present

## 2019-07-24 DIAGNOSIS — R32 Unspecified urinary incontinence: Secondary | ICD-10-CM | POA: Diagnosis not present

## 2019-07-24 DIAGNOSIS — E559 Vitamin D deficiency, unspecified: Secondary | ICD-10-CM | POA: Diagnosis not present

## 2019-07-24 DIAGNOSIS — C9110 Chronic lymphocytic leukemia of B-cell type not having achieved remission: Secondary | ICD-10-CM | POA: Diagnosis not present

## 2019-07-24 DIAGNOSIS — M545 Low back pain: Secondary | ICD-10-CM | POA: Diagnosis not present

## 2019-07-24 DIAGNOSIS — F418 Other specified anxiety disorders: Secondary | ICD-10-CM | POA: Diagnosis not present

## 2019-07-24 DIAGNOSIS — B356 Tinea cruris: Secondary | ICD-10-CM | POA: Diagnosis not present

## 2019-07-24 DIAGNOSIS — I10 Essential (primary) hypertension: Secondary | ICD-10-CM | POA: Diagnosis not present

## 2019-07-24 DIAGNOSIS — E119 Type 2 diabetes mellitus without complications: Secondary | ICD-10-CM | POA: Diagnosis not present

## 2019-07-24 DIAGNOSIS — R160 Hepatomegaly, not elsewhere classified: Secondary | ICD-10-CM | POA: Diagnosis not present

## 2019-07-24 DIAGNOSIS — R197 Diarrhea, unspecified: Secondary | ICD-10-CM | POA: Diagnosis not present

## 2019-07-24 DIAGNOSIS — D649 Anemia, unspecified: Secondary | ICD-10-CM | POA: Diagnosis not present

## 2019-07-24 DIAGNOSIS — K5909 Other constipation: Secondary | ICD-10-CM | POA: Diagnosis not present

## 2019-07-24 DIAGNOSIS — G309 Alzheimer's disease, unspecified: Secondary | ICD-10-CM | POA: Diagnosis not present

## 2019-07-25 DIAGNOSIS — M545 Low back pain: Secondary | ICD-10-CM | POA: Diagnosis not present

## 2019-07-25 DIAGNOSIS — K5909 Other constipation: Secondary | ICD-10-CM | POA: Diagnosis not present

## 2019-07-25 DIAGNOSIS — G309 Alzheimer's disease, unspecified: Secondary | ICD-10-CM | POA: Diagnosis not present

## 2019-07-25 DIAGNOSIS — C9110 Chronic lymphocytic leukemia of B-cell type not having achieved remission: Secondary | ICD-10-CM | POA: Diagnosis not present

## 2019-07-25 DIAGNOSIS — R590 Localized enlarged lymph nodes: Secondary | ICD-10-CM | POA: Diagnosis not present

## 2019-07-25 DIAGNOSIS — R32 Unspecified urinary incontinence: Secondary | ICD-10-CM | POA: Diagnosis not present

## 2019-07-27 DIAGNOSIS — G309 Alzheimer's disease, unspecified: Secondary | ICD-10-CM | POA: Diagnosis not present

## 2019-07-27 DIAGNOSIS — K5909 Other constipation: Secondary | ICD-10-CM | POA: Diagnosis not present

## 2019-07-27 DIAGNOSIS — C9110 Chronic lymphocytic leukemia of B-cell type not having achieved remission: Secondary | ICD-10-CM | POA: Diagnosis not present

## 2019-07-27 DIAGNOSIS — R590 Localized enlarged lymph nodes: Secondary | ICD-10-CM | POA: Diagnosis not present

## 2019-07-27 DIAGNOSIS — M545 Low back pain: Secondary | ICD-10-CM | POA: Diagnosis not present

## 2019-07-27 DIAGNOSIS — R32 Unspecified urinary incontinence: Secondary | ICD-10-CM | POA: Diagnosis not present

## 2019-07-31 DIAGNOSIS — C9110 Chronic lymphocytic leukemia of B-cell type not having achieved remission: Secondary | ICD-10-CM | POA: Diagnosis not present

## 2019-07-31 DIAGNOSIS — M545 Low back pain: Secondary | ICD-10-CM | POA: Diagnosis not present

## 2019-07-31 DIAGNOSIS — K5909 Other constipation: Secondary | ICD-10-CM | POA: Diagnosis not present

## 2019-07-31 DIAGNOSIS — R590 Localized enlarged lymph nodes: Secondary | ICD-10-CM | POA: Diagnosis not present

## 2019-07-31 DIAGNOSIS — R32 Unspecified urinary incontinence: Secondary | ICD-10-CM | POA: Diagnosis not present

## 2019-07-31 DIAGNOSIS — G309 Alzheimer's disease, unspecified: Secondary | ICD-10-CM | POA: Diagnosis not present

## 2019-08-01 DIAGNOSIS — G309 Alzheimer's disease, unspecified: Secondary | ICD-10-CM | POA: Diagnosis not present

## 2019-08-01 DIAGNOSIS — K5909 Other constipation: Secondary | ICD-10-CM | POA: Diagnosis not present

## 2019-08-01 DIAGNOSIS — C9110 Chronic lymphocytic leukemia of B-cell type not having achieved remission: Secondary | ICD-10-CM | POA: Diagnosis not present

## 2019-08-01 DIAGNOSIS — R590 Localized enlarged lymph nodes: Secondary | ICD-10-CM | POA: Diagnosis not present

## 2019-08-01 DIAGNOSIS — M545 Low back pain: Secondary | ICD-10-CM | POA: Diagnosis not present

## 2019-08-01 DIAGNOSIS — R32 Unspecified urinary incontinence: Secondary | ICD-10-CM | POA: Diagnosis not present

## 2019-08-03 DIAGNOSIS — R590 Localized enlarged lymph nodes: Secondary | ICD-10-CM | POA: Diagnosis not present

## 2019-08-03 DIAGNOSIS — M545 Low back pain: Secondary | ICD-10-CM | POA: Diagnosis not present

## 2019-08-03 DIAGNOSIS — K5909 Other constipation: Secondary | ICD-10-CM | POA: Diagnosis not present

## 2019-08-03 DIAGNOSIS — G309 Alzheimer's disease, unspecified: Secondary | ICD-10-CM | POA: Diagnosis not present

## 2019-08-03 DIAGNOSIS — R32 Unspecified urinary incontinence: Secondary | ICD-10-CM | POA: Diagnosis not present

## 2019-08-03 DIAGNOSIS — C9110 Chronic lymphocytic leukemia of B-cell type not having achieved remission: Secondary | ICD-10-CM | POA: Diagnosis not present

## 2019-08-07 DIAGNOSIS — C9110 Chronic lymphocytic leukemia of B-cell type not having achieved remission: Secondary | ICD-10-CM | POA: Diagnosis not present

## 2019-08-07 DIAGNOSIS — R32 Unspecified urinary incontinence: Secondary | ICD-10-CM | POA: Diagnosis not present

## 2019-08-07 DIAGNOSIS — R590 Localized enlarged lymph nodes: Secondary | ICD-10-CM | POA: Diagnosis not present

## 2019-08-07 DIAGNOSIS — K5909 Other constipation: Secondary | ICD-10-CM | POA: Diagnosis not present

## 2019-08-07 DIAGNOSIS — G309 Alzheimer's disease, unspecified: Secondary | ICD-10-CM | POA: Diagnosis not present

## 2019-08-07 DIAGNOSIS — M545 Low back pain: Secondary | ICD-10-CM | POA: Diagnosis not present

## 2019-08-08 DIAGNOSIS — M545 Low back pain: Secondary | ICD-10-CM | POA: Diagnosis not present

## 2019-08-08 DIAGNOSIS — R590 Localized enlarged lymph nodes: Secondary | ICD-10-CM | POA: Diagnosis not present

## 2019-08-08 DIAGNOSIS — R32 Unspecified urinary incontinence: Secondary | ICD-10-CM | POA: Diagnosis not present

## 2019-08-08 DIAGNOSIS — K5909 Other constipation: Secondary | ICD-10-CM | POA: Diagnosis not present

## 2019-08-08 DIAGNOSIS — C9110 Chronic lymphocytic leukemia of B-cell type not having achieved remission: Secondary | ICD-10-CM | POA: Diagnosis not present

## 2019-08-08 DIAGNOSIS — G309 Alzheimer's disease, unspecified: Secondary | ICD-10-CM | POA: Diagnosis not present

## 2019-08-10 DIAGNOSIS — R590 Localized enlarged lymph nodes: Secondary | ICD-10-CM | POA: Diagnosis not present

## 2019-08-10 DIAGNOSIS — M545 Low back pain: Secondary | ICD-10-CM | POA: Diagnosis not present

## 2019-08-10 DIAGNOSIS — R32 Unspecified urinary incontinence: Secondary | ICD-10-CM | POA: Diagnosis not present

## 2019-08-10 DIAGNOSIS — K5909 Other constipation: Secondary | ICD-10-CM | POA: Diagnosis not present

## 2019-08-10 DIAGNOSIS — C9110 Chronic lymphocytic leukemia of B-cell type not having achieved remission: Secondary | ICD-10-CM | POA: Diagnosis not present

## 2019-08-10 DIAGNOSIS — G309 Alzheimer's disease, unspecified: Secondary | ICD-10-CM | POA: Diagnosis not present

## 2019-08-14 DIAGNOSIS — R32 Unspecified urinary incontinence: Secondary | ICD-10-CM | POA: Diagnosis not present

## 2019-08-14 DIAGNOSIS — M545 Low back pain: Secondary | ICD-10-CM | POA: Diagnosis not present

## 2019-08-14 DIAGNOSIS — G309 Alzheimer's disease, unspecified: Secondary | ICD-10-CM | POA: Diagnosis not present

## 2019-08-14 DIAGNOSIS — K5909 Other constipation: Secondary | ICD-10-CM | POA: Diagnosis not present

## 2019-08-14 DIAGNOSIS — C9110 Chronic lymphocytic leukemia of B-cell type not having achieved remission: Secondary | ICD-10-CM | POA: Diagnosis not present

## 2019-08-14 DIAGNOSIS — R590 Localized enlarged lymph nodes: Secondary | ICD-10-CM | POA: Diagnosis not present

## 2019-08-15 DIAGNOSIS — C9110 Chronic lymphocytic leukemia of B-cell type not having achieved remission: Secondary | ICD-10-CM | POA: Diagnosis not present

## 2019-08-15 DIAGNOSIS — M545 Low back pain: Secondary | ICD-10-CM | POA: Diagnosis not present

## 2019-08-15 DIAGNOSIS — K5909 Other constipation: Secondary | ICD-10-CM | POA: Diagnosis not present

## 2019-08-15 DIAGNOSIS — G309 Alzheimer's disease, unspecified: Secondary | ICD-10-CM | POA: Diagnosis not present

## 2019-08-15 DIAGNOSIS — R32 Unspecified urinary incontinence: Secondary | ICD-10-CM | POA: Diagnosis not present

## 2019-08-15 DIAGNOSIS — R590 Localized enlarged lymph nodes: Secondary | ICD-10-CM | POA: Diagnosis not present

## 2019-08-17 DIAGNOSIS — G309 Alzheimer's disease, unspecified: Secondary | ICD-10-CM | POA: Diagnosis not present

## 2019-08-17 DIAGNOSIS — R32 Unspecified urinary incontinence: Secondary | ICD-10-CM | POA: Diagnosis not present

## 2019-08-17 DIAGNOSIS — M545 Low back pain: Secondary | ICD-10-CM | POA: Diagnosis not present

## 2019-08-17 DIAGNOSIS — C9110 Chronic lymphocytic leukemia of B-cell type not having achieved remission: Secondary | ICD-10-CM | POA: Diagnosis not present

## 2019-08-17 DIAGNOSIS — K5909 Other constipation: Secondary | ICD-10-CM | POA: Diagnosis not present

## 2019-08-17 DIAGNOSIS — R590 Localized enlarged lymph nodes: Secondary | ICD-10-CM | POA: Diagnosis not present

## 2019-08-21 DIAGNOSIS — M545 Low back pain: Secondary | ICD-10-CM | POA: Diagnosis not present

## 2019-08-21 DIAGNOSIS — K5909 Other constipation: Secondary | ICD-10-CM | POA: Diagnosis not present

## 2019-08-21 DIAGNOSIS — R32 Unspecified urinary incontinence: Secondary | ICD-10-CM | POA: Diagnosis not present

## 2019-08-21 DIAGNOSIS — R590 Localized enlarged lymph nodes: Secondary | ICD-10-CM | POA: Diagnosis not present

## 2019-08-21 DIAGNOSIS — C9110 Chronic lymphocytic leukemia of B-cell type not having achieved remission: Secondary | ICD-10-CM | POA: Diagnosis not present

## 2019-08-21 DIAGNOSIS — G309 Alzheimer's disease, unspecified: Secondary | ICD-10-CM | POA: Diagnosis not present

## 2019-08-22 DIAGNOSIS — C9110 Chronic lymphocytic leukemia of B-cell type not having achieved remission: Secondary | ICD-10-CM | POA: Diagnosis not present

## 2019-08-22 DIAGNOSIS — G309 Alzheimer's disease, unspecified: Secondary | ICD-10-CM | POA: Diagnosis not present

## 2019-08-22 DIAGNOSIS — M545 Low back pain: Secondary | ICD-10-CM | POA: Diagnosis not present

## 2019-08-22 DIAGNOSIS — R590 Localized enlarged lymph nodes: Secondary | ICD-10-CM | POA: Diagnosis not present

## 2019-08-22 DIAGNOSIS — K5909 Other constipation: Secondary | ICD-10-CM | POA: Diagnosis not present

## 2019-08-22 DIAGNOSIS — R32 Unspecified urinary incontinence: Secondary | ICD-10-CM | POA: Diagnosis not present

## 2019-08-23 DIAGNOSIS — G309 Alzheimer's disease, unspecified: Secondary | ICD-10-CM | POA: Diagnosis not present

## 2019-08-23 DIAGNOSIS — I10 Essential (primary) hypertension: Secondary | ICD-10-CM | POA: Diagnosis not present

## 2019-08-23 DIAGNOSIS — E7800 Pure hypercholesterolemia, unspecified: Secondary | ICD-10-CM | POA: Diagnosis not present

## 2019-08-23 DIAGNOSIS — Z9119 Patient's noncompliance with other medical treatment and regimen: Secondary | ICD-10-CM | POA: Diagnosis not present

## 2019-08-23 DIAGNOSIS — E559 Vitamin D deficiency, unspecified: Secondary | ICD-10-CM | POA: Diagnosis not present

## 2019-08-23 DIAGNOSIS — B356 Tinea cruris: Secondary | ICD-10-CM | POA: Diagnosis not present

## 2019-08-23 DIAGNOSIS — F418 Other specified anxiety disorders: Secondary | ICD-10-CM | POA: Diagnosis not present

## 2019-08-23 DIAGNOSIS — R590 Localized enlarged lymph nodes: Secondary | ICD-10-CM | POA: Diagnosis not present

## 2019-08-23 DIAGNOSIS — Z87440 Personal history of urinary (tract) infections: Secondary | ICD-10-CM | POA: Diagnosis not present

## 2019-08-23 DIAGNOSIS — R197 Diarrhea, unspecified: Secondary | ICD-10-CM | POA: Diagnosis not present

## 2019-08-23 DIAGNOSIS — D649 Anemia, unspecified: Secondary | ICD-10-CM | POA: Diagnosis not present

## 2019-08-23 DIAGNOSIS — M109 Gout, unspecified: Secondary | ICD-10-CM | POA: Diagnosis not present

## 2019-08-23 DIAGNOSIS — M545 Low back pain: Secondary | ICD-10-CM | POA: Diagnosis not present

## 2019-08-23 DIAGNOSIS — R160 Hepatomegaly, not elsewhere classified: Secondary | ICD-10-CM | POA: Diagnosis not present

## 2019-08-23 DIAGNOSIS — K5909 Other constipation: Secondary | ICD-10-CM | POA: Diagnosis not present

## 2019-08-23 DIAGNOSIS — C9110 Chronic lymphocytic leukemia of B-cell type not having achieved remission: Secondary | ICD-10-CM | POA: Diagnosis not present

## 2019-08-23 DIAGNOSIS — E119 Type 2 diabetes mellitus without complications: Secondary | ICD-10-CM | POA: Diagnosis not present

## 2019-08-23 DIAGNOSIS — R32 Unspecified urinary incontinence: Secondary | ICD-10-CM | POA: Diagnosis not present

## 2019-08-24 DIAGNOSIS — C9110 Chronic lymphocytic leukemia of B-cell type not having achieved remission: Secondary | ICD-10-CM | POA: Diagnosis not present

## 2019-08-24 DIAGNOSIS — M545 Low back pain: Secondary | ICD-10-CM | POA: Diagnosis not present

## 2019-08-24 DIAGNOSIS — G309 Alzheimer's disease, unspecified: Secondary | ICD-10-CM | POA: Diagnosis not present

## 2019-08-24 DIAGNOSIS — R32 Unspecified urinary incontinence: Secondary | ICD-10-CM | POA: Diagnosis not present

## 2019-08-24 DIAGNOSIS — K5909 Other constipation: Secondary | ICD-10-CM | POA: Diagnosis not present

## 2019-08-24 DIAGNOSIS — R590 Localized enlarged lymph nodes: Secondary | ICD-10-CM | POA: Diagnosis not present

## 2019-08-27 DIAGNOSIS — C9110 Chronic lymphocytic leukemia of B-cell type not having achieved remission: Secondary | ICD-10-CM | POA: Diagnosis not present

## 2019-08-27 DIAGNOSIS — G309 Alzheimer's disease, unspecified: Secondary | ICD-10-CM | POA: Diagnosis not present

## 2019-08-27 DIAGNOSIS — M545 Low back pain: Secondary | ICD-10-CM | POA: Diagnosis not present

## 2019-08-27 DIAGNOSIS — K5909 Other constipation: Secondary | ICD-10-CM | POA: Diagnosis not present

## 2019-08-27 DIAGNOSIS — R590 Localized enlarged lymph nodes: Secondary | ICD-10-CM | POA: Diagnosis not present

## 2019-08-27 DIAGNOSIS — R32 Unspecified urinary incontinence: Secondary | ICD-10-CM | POA: Diagnosis not present

## 2019-08-28 DIAGNOSIS — R590 Localized enlarged lymph nodes: Secondary | ICD-10-CM | POA: Diagnosis not present

## 2019-08-28 DIAGNOSIS — K5909 Other constipation: Secondary | ICD-10-CM | POA: Diagnosis not present

## 2019-08-28 DIAGNOSIS — M545 Low back pain: Secondary | ICD-10-CM | POA: Diagnosis not present

## 2019-08-28 DIAGNOSIS — R32 Unspecified urinary incontinence: Secondary | ICD-10-CM | POA: Diagnosis not present

## 2019-08-28 DIAGNOSIS — C9110 Chronic lymphocytic leukemia of B-cell type not having achieved remission: Secondary | ICD-10-CM | POA: Diagnosis not present

## 2019-08-28 DIAGNOSIS — G309 Alzheimer's disease, unspecified: Secondary | ICD-10-CM | POA: Diagnosis not present

## 2019-08-29 DIAGNOSIS — K5909 Other constipation: Secondary | ICD-10-CM | POA: Diagnosis not present

## 2019-08-29 DIAGNOSIS — R32 Unspecified urinary incontinence: Secondary | ICD-10-CM | POA: Diagnosis not present

## 2019-08-29 DIAGNOSIS — R590 Localized enlarged lymph nodes: Secondary | ICD-10-CM | POA: Diagnosis not present

## 2019-08-29 DIAGNOSIS — M545 Low back pain: Secondary | ICD-10-CM | POA: Diagnosis not present

## 2019-08-29 DIAGNOSIS — G309 Alzheimer's disease, unspecified: Secondary | ICD-10-CM | POA: Diagnosis not present

## 2019-08-29 DIAGNOSIS — C9110 Chronic lymphocytic leukemia of B-cell type not having achieved remission: Secondary | ICD-10-CM | POA: Diagnosis not present

## 2019-08-31 DIAGNOSIS — G309 Alzheimer's disease, unspecified: Secondary | ICD-10-CM | POA: Diagnosis not present

## 2019-08-31 DIAGNOSIS — R32 Unspecified urinary incontinence: Secondary | ICD-10-CM | POA: Diagnosis not present

## 2019-08-31 DIAGNOSIS — M545 Low back pain: Secondary | ICD-10-CM | POA: Diagnosis not present

## 2019-08-31 DIAGNOSIS — K5909 Other constipation: Secondary | ICD-10-CM | POA: Diagnosis not present

## 2019-08-31 DIAGNOSIS — C9110 Chronic lymphocytic leukemia of B-cell type not having achieved remission: Secondary | ICD-10-CM | POA: Diagnosis not present

## 2019-08-31 DIAGNOSIS — R590 Localized enlarged lymph nodes: Secondary | ICD-10-CM | POA: Diagnosis not present

## 2019-09-03 DIAGNOSIS — G309 Alzheimer's disease, unspecified: Secondary | ICD-10-CM | POA: Diagnosis not present

## 2019-09-03 DIAGNOSIS — R32 Unspecified urinary incontinence: Secondary | ICD-10-CM | POA: Diagnosis not present

## 2019-09-03 DIAGNOSIS — M545 Low back pain: Secondary | ICD-10-CM | POA: Diagnosis not present

## 2019-09-03 DIAGNOSIS — K5909 Other constipation: Secondary | ICD-10-CM | POA: Diagnosis not present

## 2019-09-03 DIAGNOSIS — C9110 Chronic lymphocytic leukemia of B-cell type not having achieved remission: Secondary | ICD-10-CM | POA: Diagnosis not present

## 2019-09-03 DIAGNOSIS — R590 Localized enlarged lymph nodes: Secondary | ICD-10-CM | POA: Diagnosis not present

## 2019-09-04 DIAGNOSIS — C9110 Chronic lymphocytic leukemia of B-cell type not having achieved remission: Secondary | ICD-10-CM | POA: Diagnosis not present

## 2019-09-04 DIAGNOSIS — R32 Unspecified urinary incontinence: Secondary | ICD-10-CM | POA: Diagnosis not present

## 2019-09-04 DIAGNOSIS — M545 Low back pain: Secondary | ICD-10-CM | POA: Diagnosis not present

## 2019-09-04 DIAGNOSIS — G309 Alzheimer's disease, unspecified: Secondary | ICD-10-CM | POA: Diagnosis not present

## 2019-09-04 DIAGNOSIS — K5909 Other constipation: Secondary | ICD-10-CM | POA: Diagnosis not present

## 2019-09-04 DIAGNOSIS — R590 Localized enlarged lymph nodes: Secondary | ICD-10-CM | POA: Diagnosis not present

## 2019-09-05 DIAGNOSIS — M545 Low back pain: Secondary | ICD-10-CM | POA: Diagnosis not present

## 2019-09-05 DIAGNOSIS — R32 Unspecified urinary incontinence: Secondary | ICD-10-CM | POA: Diagnosis not present

## 2019-09-05 DIAGNOSIS — K5909 Other constipation: Secondary | ICD-10-CM | POA: Diagnosis not present

## 2019-09-05 DIAGNOSIS — C9110 Chronic lymphocytic leukemia of B-cell type not having achieved remission: Secondary | ICD-10-CM | POA: Diagnosis not present

## 2019-09-05 DIAGNOSIS — R590 Localized enlarged lymph nodes: Secondary | ICD-10-CM | POA: Diagnosis not present

## 2019-09-05 DIAGNOSIS — G309 Alzheimer's disease, unspecified: Secondary | ICD-10-CM | POA: Diagnosis not present

## 2019-09-11 DIAGNOSIS — K5909 Other constipation: Secondary | ICD-10-CM | POA: Diagnosis not present

## 2019-09-11 DIAGNOSIS — G309 Alzheimer's disease, unspecified: Secondary | ICD-10-CM | POA: Diagnosis not present

## 2019-09-11 DIAGNOSIS — M545 Low back pain: Secondary | ICD-10-CM | POA: Diagnosis not present

## 2019-09-11 DIAGNOSIS — C9110 Chronic lymphocytic leukemia of B-cell type not having achieved remission: Secondary | ICD-10-CM | POA: Diagnosis not present

## 2019-09-11 DIAGNOSIS — R32 Unspecified urinary incontinence: Secondary | ICD-10-CM | POA: Diagnosis not present

## 2019-09-11 DIAGNOSIS — R590 Localized enlarged lymph nodes: Secondary | ICD-10-CM | POA: Diagnosis not present

## 2019-09-12 DIAGNOSIS — R32 Unspecified urinary incontinence: Secondary | ICD-10-CM | POA: Diagnosis not present

## 2019-09-12 DIAGNOSIS — R590 Localized enlarged lymph nodes: Secondary | ICD-10-CM | POA: Diagnosis not present

## 2019-09-12 DIAGNOSIS — C9110 Chronic lymphocytic leukemia of B-cell type not having achieved remission: Secondary | ICD-10-CM | POA: Diagnosis not present

## 2019-09-12 DIAGNOSIS — K5909 Other constipation: Secondary | ICD-10-CM | POA: Diagnosis not present

## 2019-09-12 DIAGNOSIS — G309 Alzheimer's disease, unspecified: Secondary | ICD-10-CM | POA: Diagnosis not present

## 2019-09-12 DIAGNOSIS — M545 Low back pain: Secondary | ICD-10-CM | POA: Diagnosis not present

## 2019-09-13 DIAGNOSIS — K5909 Other constipation: Secondary | ICD-10-CM | POA: Diagnosis not present

## 2019-09-13 DIAGNOSIS — G309 Alzheimer's disease, unspecified: Secondary | ICD-10-CM | POA: Diagnosis not present

## 2019-09-13 DIAGNOSIS — R32 Unspecified urinary incontinence: Secondary | ICD-10-CM | POA: Diagnosis not present

## 2019-09-13 DIAGNOSIS — C9110 Chronic lymphocytic leukemia of B-cell type not having achieved remission: Secondary | ICD-10-CM | POA: Diagnosis not present

## 2019-09-13 DIAGNOSIS — R590 Localized enlarged lymph nodes: Secondary | ICD-10-CM | POA: Diagnosis not present

## 2019-09-13 DIAGNOSIS — M545 Low back pain: Secondary | ICD-10-CM | POA: Diagnosis not present

## 2019-09-14 DIAGNOSIS — K5909 Other constipation: Secondary | ICD-10-CM | POA: Diagnosis not present

## 2019-09-14 DIAGNOSIS — M545 Low back pain: Secondary | ICD-10-CM | POA: Diagnosis not present

## 2019-09-14 DIAGNOSIS — C9110 Chronic lymphocytic leukemia of B-cell type not having achieved remission: Secondary | ICD-10-CM | POA: Diagnosis not present

## 2019-09-14 DIAGNOSIS — R590 Localized enlarged lymph nodes: Secondary | ICD-10-CM | POA: Diagnosis not present

## 2019-09-14 DIAGNOSIS — G309 Alzheimer's disease, unspecified: Secondary | ICD-10-CM | POA: Diagnosis not present

## 2019-09-14 DIAGNOSIS — R32 Unspecified urinary incontinence: Secondary | ICD-10-CM | POA: Diagnosis not present

## 2019-09-18 DIAGNOSIS — R590 Localized enlarged lymph nodes: Secondary | ICD-10-CM | POA: Diagnosis not present

## 2019-09-18 DIAGNOSIS — K5909 Other constipation: Secondary | ICD-10-CM | POA: Diagnosis not present

## 2019-09-18 DIAGNOSIS — R32 Unspecified urinary incontinence: Secondary | ICD-10-CM | POA: Diagnosis not present

## 2019-09-18 DIAGNOSIS — M545 Low back pain: Secondary | ICD-10-CM | POA: Diagnosis not present

## 2019-09-18 DIAGNOSIS — C9110 Chronic lymphocytic leukemia of B-cell type not having achieved remission: Secondary | ICD-10-CM | POA: Diagnosis not present

## 2019-09-18 DIAGNOSIS — G309 Alzheimer's disease, unspecified: Secondary | ICD-10-CM | POA: Diagnosis not present

## 2019-09-19 DIAGNOSIS — K5909 Other constipation: Secondary | ICD-10-CM | POA: Diagnosis not present

## 2019-09-19 DIAGNOSIS — R32 Unspecified urinary incontinence: Secondary | ICD-10-CM | POA: Diagnosis not present

## 2019-09-19 DIAGNOSIS — R590 Localized enlarged lymph nodes: Secondary | ICD-10-CM | POA: Diagnosis not present

## 2019-09-19 DIAGNOSIS — G309 Alzheimer's disease, unspecified: Secondary | ICD-10-CM | POA: Diagnosis not present

## 2019-09-19 DIAGNOSIS — C9110 Chronic lymphocytic leukemia of B-cell type not having achieved remission: Secondary | ICD-10-CM | POA: Diagnosis not present

## 2019-09-19 DIAGNOSIS — M545 Low back pain: Secondary | ICD-10-CM | POA: Diagnosis not present

## 2019-09-21 DIAGNOSIS — M545 Low back pain: Secondary | ICD-10-CM | POA: Diagnosis not present

## 2019-09-21 DIAGNOSIS — K5909 Other constipation: Secondary | ICD-10-CM | POA: Diagnosis not present

## 2019-09-21 DIAGNOSIS — C9110 Chronic lymphocytic leukemia of B-cell type not having achieved remission: Secondary | ICD-10-CM | POA: Diagnosis not present

## 2019-09-21 DIAGNOSIS — R32 Unspecified urinary incontinence: Secondary | ICD-10-CM | POA: Diagnosis not present

## 2019-09-21 DIAGNOSIS — R590 Localized enlarged lymph nodes: Secondary | ICD-10-CM | POA: Diagnosis not present

## 2019-09-21 DIAGNOSIS — G309 Alzheimer's disease, unspecified: Secondary | ICD-10-CM | POA: Diagnosis not present

## 2019-09-23 DIAGNOSIS — D649 Anemia, unspecified: Secondary | ICD-10-CM | POA: Diagnosis not present

## 2019-09-23 DIAGNOSIS — E119 Type 2 diabetes mellitus without complications: Secondary | ICD-10-CM | POA: Diagnosis not present

## 2019-09-23 DIAGNOSIS — R32 Unspecified urinary incontinence: Secondary | ICD-10-CM | POA: Diagnosis not present

## 2019-09-23 DIAGNOSIS — R590 Localized enlarged lymph nodes: Secondary | ICD-10-CM | POA: Diagnosis not present

## 2019-09-23 DIAGNOSIS — K5909 Other constipation: Secondary | ICD-10-CM | POA: Diagnosis not present

## 2019-09-23 DIAGNOSIS — G309 Alzheimer's disease, unspecified: Secondary | ICD-10-CM | POA: Diagnosis not present

## 2019-09-23 DIAGNOSIS — M109 Gout, unspecified: Secondary | ICD-10-CM | POA: Diagnosis not present

## 2019-09-23 DIAGNOSIS — E7800 Pure hypercholesterolemia, unspecified: Secondary | ICD-10-CM | POA: Diagnosis not present

## 2019-09-23 DIAGNOSIS — E559 Vitamin D deficiency, unspecified: Secondary | ICD-10-CM | POA: Diagnosis not present

## 2019-09-23 DIAGNOSIS — R197 Diarrhea, unspecified: Secondary | ICD-10-CM | POA: Diagnosis not present

## 2019-09-23 DIAGNOSIS — Z87440 Personal history of urinary (tract) infections: Secondary | ICD-10-CM | POA: Diagnosis not present

## 2019-09-23 DIAGNOSIS — I10 Essential (primary) hypertension: Secondary | ICD-10-CM | POA: Diagnosis not present

## 2019-09-23 DIAGNOSIS — B356 Tinea cruris: Secondary | ICD-10-CM | POA: Diagnosis not present

## 2019-09-23 DIAGNOSIS — C9110 Chronic lymphocytic leukemia of B-cell type not having achieved remission: Secondary | ICD-10-CM | POA: Diagnosis not present

## 2019-09-23 DIAGNOSIS — F418 Other specified anxiety disorders: Secondary | ICD-10-CM | POA: Diagnosis not present

## 2019-09-23 DIAGNOSIS — Z9119 Patient's noncompliance with other medical treatment and regimen: Secondary | ICD-10-CM | POA: Diagnosis not present

## 2019-09-23 DIAGNOSIS — R160 Hepatomegaly, not elsewhere classified: Secondary | ICD-10-CM | POA: Diagnosis not present

## 2019-09-23 DIAGNOSIS — M545 Low back pain: Secondary | ICD-10-CM | POA: Diagnosis not present

## 2019-09-25 DIAGNOSIS — C9110 Chronic lymphocytic leukemia of B-cell type not having achieved remission: Secondary | ICD-10-CM | POA: Diagnosis not present

## 2019-09-25 DIAGNOSIS — R32 Unspecified urinary incontinence: Secondary | ICD-10-CM | POA: Diagnosis not present

## 2019-09-25 DIAGNOSIS — G309 Alzheimer's disease, unspecified: Secondary | ICD-10-CM | POA: Diagnosis not present

## 2019-09-25 DIAGNOSIS — R590 Localized enlarged lymph nodes: Secondary | ICD-10-CM | POA: Diagnosis not present

## 2019-09-25 DIAGNOSIS — M545 Low back pain: Secondary | ICD-10-CM | POA: Diagnosis not present

## 2019-09-25 DIAGNOSIS — K5909 Other constipation: Secondary | ICD-10-CM | POA: Diagnosis not present

## 2019-09-26 DIAGNOSIS — M545 Low back pain: Secondary | ICD-10-CM | POA: Diagnosis not present

## 2019-09-26 DIAGNOSIS — K5909 Other constipation: Secondary | ICD-10-CM | POA: Diagnosis not present

## 2019-09-26 DIAGNOSIS — C9110 Chronic lymphocytic leukemia of B-cell type not having achieved remission: Secondary | ICD-10-CM | POA: Diagnosis not present

## 2019-09-26 DIAGNOSIS — G309 Alzheimer's disease, unspecified: Secondary | ICD-10-CM | POA: Diagnosis not present

## 2019-09-26 DIAGNOSIS — R590 Localized enlarged lymph nodes: Secondary | ICD-10-CM | POA: Diagnosis not present

## 2019-09-26 DIAGNOSIS — R32 Unspecified urinary incontinence: Secondary | ICD-10-CM | POA: Diagnosis not present

## 2019-09-28 DIAGNOSIS — G309 Alzheimer's disease, unspecified: Secondary | ICD-10-CM | POA: Diagnosis not present

## 2019-09-28 DIAGNOSIS — C9110 Chronic lymphocytic leukemia of B-cell type not having achieved remission: Secondary | ICD-10-CM | POA: Diagnosis not present

## 2019-09-28 DIAGNOSIS — R32 Unspecified urinary incontinence: Secondary | ICD-10-CM | POA: Diagnosis not present

## 2019-09-28 DIAGNOSIS — K5909 Other constipation: Secondary | ICD-10-CM | POA: Diagnosis not present

## 2019-09-28 DIAGNOSIS — R590 Localized enlarged lymph nodes: Secondary | ICD-10-CM | POA: Diagnosis not present

## 2019-09-28 DIAGNOSIS — M545 Low back pain: Secondary | ICD-10-CM | POA: Diagnosis not present

## 2019-10-02 DIAGNOSIS — R32 Unspecified urinary incontinence: Secondary | ICD-10-CM | POA: Diagnosis not present

## 2019-10-02 DIAGNOSIS — M545 Low back pain: Secondary | ICD-10-CM | POA: Diagnosis not present

## 2019-10-02 DIAGNOSIS — C9110 Chronic lymphocytic leukemia of B-cell type not having achieved remission: Secondary | ICD-10-CM | POA: Diagnosis not present

## 2019-10-02 DIAGNOSIS — K5909 Other constipation: Secondary | ICD-10-CM | POA: Diagnosis not present

## 2019-10-02 DIAGNOSIS — G309 Alzheimer's disease, unspecified: Secondary | ICD-10-CM | POA: Diagnosis not present

## 2019-10-02 DIAGNOSIS — R590 Localized enlarged lymph nodes: Secondary | ICD-10-CM | POA: Diagnosis not present

## 2019-10-03 DIAGNOSIS — G309 Alzheimer's disease, unspecified: Secondary | ICD-10-CM | POA: Diagnosis not present

## 2019-10-03 DIAGNOSIS — R590 Localized enlarged lymph nodes: Secondary | ICD-10-CM | POA: Diagnosis not present

## 2019-10-03 DIAGNOSIS — R32 Unspecified urinary incontinence: Secondary | ICD-10-CM | POA: Diagnosis not present

## 2019-10-03 DIAGNOSIS — C9110 Chronic lymphocytic leukemia of B-cell type not having achieved remission: Secondary | ICD-10-CM | POA: Diagnosis not present

## 2019-10-03 DIAGNOSIS — M545 Low back pain: Secondary | ICD-10-CM | POA: Diagnosis not present

## 2019-10-03 DIAGNOSIS — K5909 Other constipation: Secondary | ICD-10-CM | POA: Diagnosis not present

## 2019-10-04 DIAGNOSIS — C9110 Chronic lymphocytic leukemia of B-cell type not having achieved remission: Secondary | ICD-10-CM | POA: Diagnosis not present

## 2019-10-04 DIAGNOSIS — G309 Alzheimer's disease, unspecified: Secondary | ICD-10-CM | POA: Diagnosis not present

## 2019-10-04 DIAGNOSIS — R590 Localized enlarged lymph nodes: Secondary | ICD-10-CM | POA: Diagnosis not present

## 2019-10-04 DIAGNOSIS — R32 Unspecified urinary incontinence: Secondary | ICD-10-CM | POA: Diagnosis not present

## 2019-10-04 DIAGNOSIS — M545 Low back pain: Secondary | ICD-10-CM | POA: Diagnosis not present

## 2019-10-04 DIAGNOSIS — K5909 Other constipation: Secondary | ICD-10-CM | POA: Diagnosis not present

## 2019-10-05 DIAGNOSIS — R590 Localized enlarged lymph nodes: Secondary | ICD-10-CM | POA: Diagnosis not present

## 2019-10-05 DIAGNOSIS — G309 Alzheimer's disease, unspecified: Secondary | ICD-10-CM | POA: Diagnosis not present

## 2019-10-05 DIAGNOSIS — R32 Unspecified urinary incontinence: Secondary | ICD-10-CM | POA: Diagnosis not present

## 2019-10-05 DIAGNOSIS — M545 Low back pain: Secondary | ICD-10-CM | POA: Diagnosis not present

## 2019-10-05 DIAGNOSIS — K5909 Other constipation: Secondary | ICD-10-CM | POA: Diagnosis not present

## 2019-10-05 DIAGNOSIS — C9110 Chronic lymphocytic leukemia of B-cell type not having achieved remission: Secondary | ICD-10-CM | POA: Diagnosis not present

## 2019-10-09 DIAGNOSIS — M545 Low back pain: Secondary | ICD-10-CM | POA: Diagnosis not present

## 2019-10-09 DIAGNOSIS — K5909 Other constipation: Secondary | ICD-10-CM | POA: Diagnosis not present

## 2019-10-09 DIAGNOSIS — R32 Unspecified urinary incontinence: Secondary | ICD-10-CM | POA: Diagnosis not present

## 2019-10-09 DIAGNOSIS — R590 Localized enlarged lymph nodes: Secondary | ICD-10-CM | POA: Diagnosis not present

## 2019-10-09 DIAGNOSIS — C9110 Chronic lymphocytic leukemia of B-cell type not having achieved remission: Secondary | ICD-10-CM | POA: Diagnosis not present

## 2019-10-09 DIAGNOSIS — G309 Alzheimer's disease, unspecified: Secondary | ICD-10-CM | POA: Diagnosis not present

## 2019-10-10 DIAGNOSIS — C9110 Chronic lymphocytic leukemia of B-cell type not having achieved remission: Secondary | ICD-10-CM | POA: Diagnosis not present

## 2019-10-10 DIAGNOSIS — K5909 Other constipation: Secondary | ICD-10-CM | POA: Diagnosis not present

## 2019-10-10 DIAGNOSIS — R32 Unspecified urinary incontinence: Secondary | ICD-10-CM | POA: Diagnosis not present

## 2019-10-10 DIAGNOSIS — M545 Low back pain: Secondary | ICD-10-CM | POA: Diagnosis not present

## 2019-10-10 DIAGNOSIS — G309 Alzheimer's disease, unspecified: Secondary | ICD-10-CM | POA: Diagnosis not present

## 2019-10-10 DIAGNOSIS — R590 Localized enlarged lymph nodes: Secondary | ICD-10-CM | POA: Diagnosis not present

## 2019-10-12 DIAGNOSIS — G309 Alzheimer's disease, unspecified: Secondary | ICD-10-CM | POA: Diagnosis not present

## 2019-10-12 DIAGNOSIS — K5909 Other constipation: Secondary | ICD-10-CM | POA: Diagnosis not present

## 2019-10-12 DIAGNOSIS — R32 Unspecified urinary incontinence: Secondary | ICD-10-CM | POA: Diagnosis not present

## 2019-10-12 DIAGNOSIS — R590 Localized enlarged lymph nodes: Secondary | ICD-10-CM | POA: Diagnosis not present

## 2019-10-12 DIAGNOSIS — M545 Low back pain: Secondary | ICD-10-CM | POA: Diagnosis not present

## 2019-10-12 DIAGNOSIS — C9110 Chronic lymphocytic leukemia of B-cell type not having achieved remission: Secondary | ICD-10-CM | POA: Diagnosis not present

## 2019-10-16 DIAGNOSIS — K5909 Other constipation: Secondary | ICD-10-CM | POA: Diagnosis not present

## 2019-10-16 DIAGNOSIS — M545 Low back pain: Secondary | ICD-10-CM | POA: Diagnosis not present

## 2019-10-16 DIAGNOSIS — R32 Unspecified urinary incontinence: Secondary | ICD-10-CM | POA: Diagnosis not present

## 2019-10-16 DIAGNOSIS — R590 Localized enlarged lymph nodes: Secondary | ICD-10-CM | POA: Diagnosis not present

## 2019-10-16 DIAGNOSIS — G309 Alzheimer's disease, unspecified: Secondary | ICD-10-CM | POA: Diagnosis not present

## 2019-10-16 DIAGNOSIS — C9110 Chronic lymphocytic leukemia of B-cell type not having achieved remission: Secondary | ICD-10-CM | POA: Diagnosis not present

## 2019-10-17 DIAGNOSIS — K5909 Other constipation: Secondary | ICD-10-CM | POA: Diagnosis not present

## 2019-10-17 DIAGNOSIS — M545 Low back pain: Secondary | ICD-10-CM | POA: Diagnosis not present

## 2019-10-17 DIAGNOSIS — G309 Alzheimer's disease, unspecified: Secondary | ICD-10-CM | POA: Diagnosis not present

## 2019-10-17 DIAGNOSIS — R590 Localized enlarged lymph nodes: Secondary | ICD-10-CM | POA: Diagnosis not present

## 2019-10-17 DIAGNOSIS — R32 Unspecified urinary incontinence: Secondary | ICD-10-CM | POA: Diagnosis not present

## 2019-10-17 DIAGNOSIS — C9110 Chronic lymphocytic leukemia of B-cell type not having achieved remission: Secondary | ICD-10-CM | POA: Diagnosis not present

## 2019-10-19 DIAGNOSIS — C9110 Chronic lymphocytic leukemia of B-cell type not having achieved remission: Secondary | ICD-10-CM | POA: Diagnosis not present

## 2019-10-19 DIAGNOSIS — M545 Low back pain: Secondary | ICD-10-CM | POA: Diagnosis not present

## 2019-10-19 DIAGNOSIS — R590 Localized enlarged lymph nodes: Secondary | ICD-10-CM | POA: Diagnosis not present

## 2019-10-19 DIAGNOSIS — K5909 Other constipation: Secondary | ICD-10-CM | POA: Diagnosis not present

## 2019-10-19 DIAGNOSIS — G309 Alzheimer's disease, unspecified: Secondary | ICD-10-CM | POA: Diagnosis not present

## 2019-10-19 DIAGNOSIS — R32 Unspecified urinary incontinence: Secondary | ICD-10-CM | POA: Diagnosis not present

## 2020-03-22 DEATH — deceased

## 2020-08-11 IMAGING — DX DG WRIST COMPLETE 3+V*L*
4 series · 4 of 4 positions shown · non-contrast
Comparison: None.

CLINICAL DATA: Recent fall with wrist pain, initial encounter

EXAM:
LEFT WRIST - COMPLETE 3+ VIEW

[wrist pa]
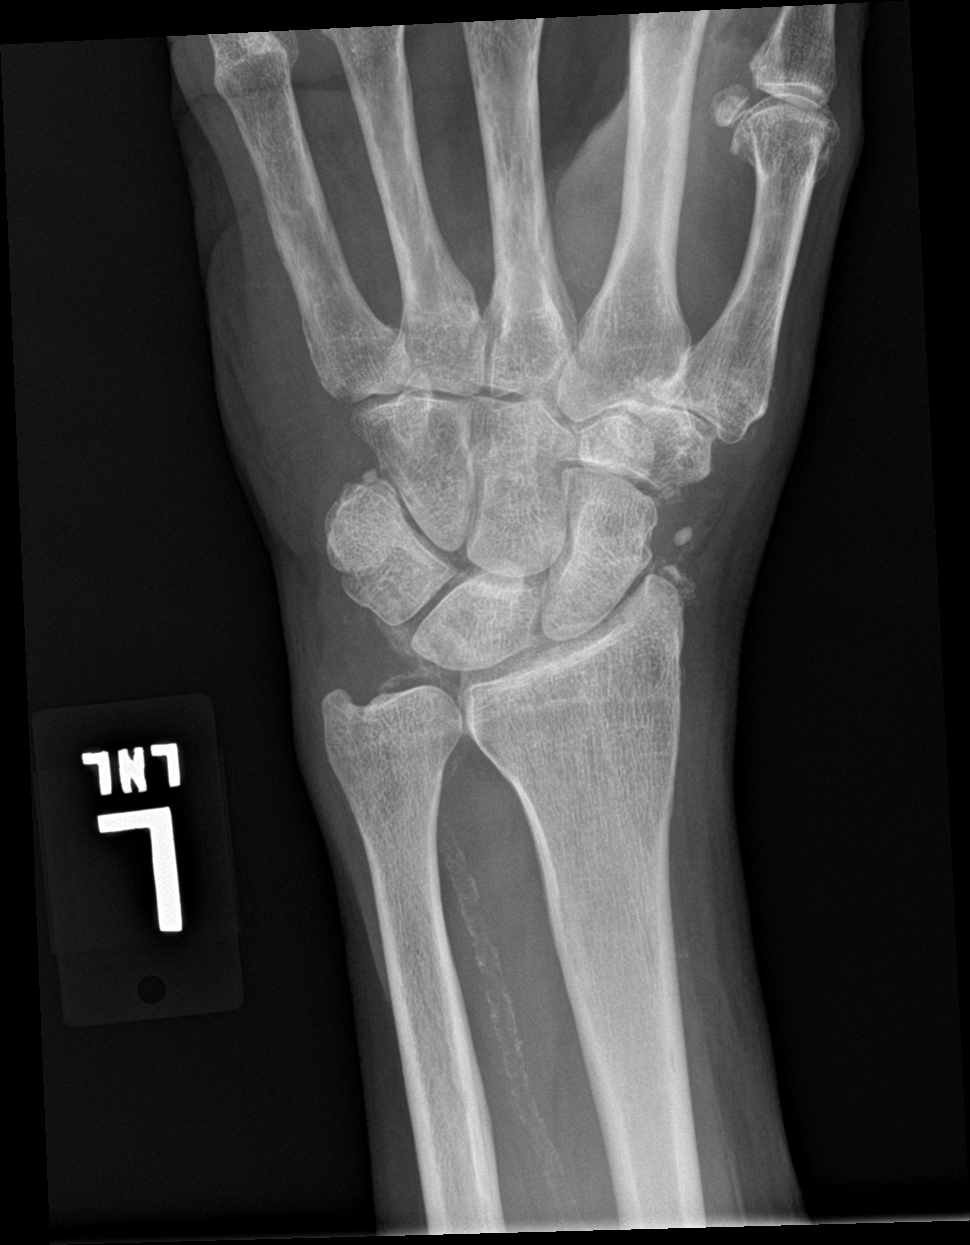

[wrist obl]
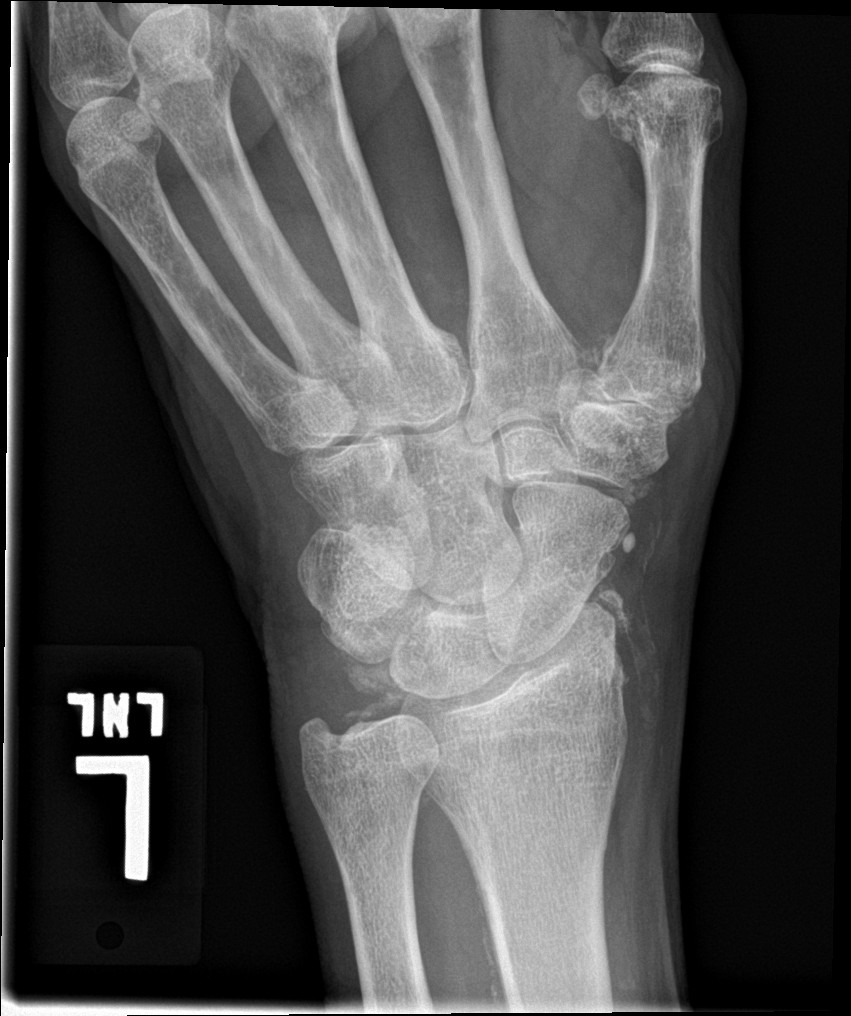

[wrist lat]
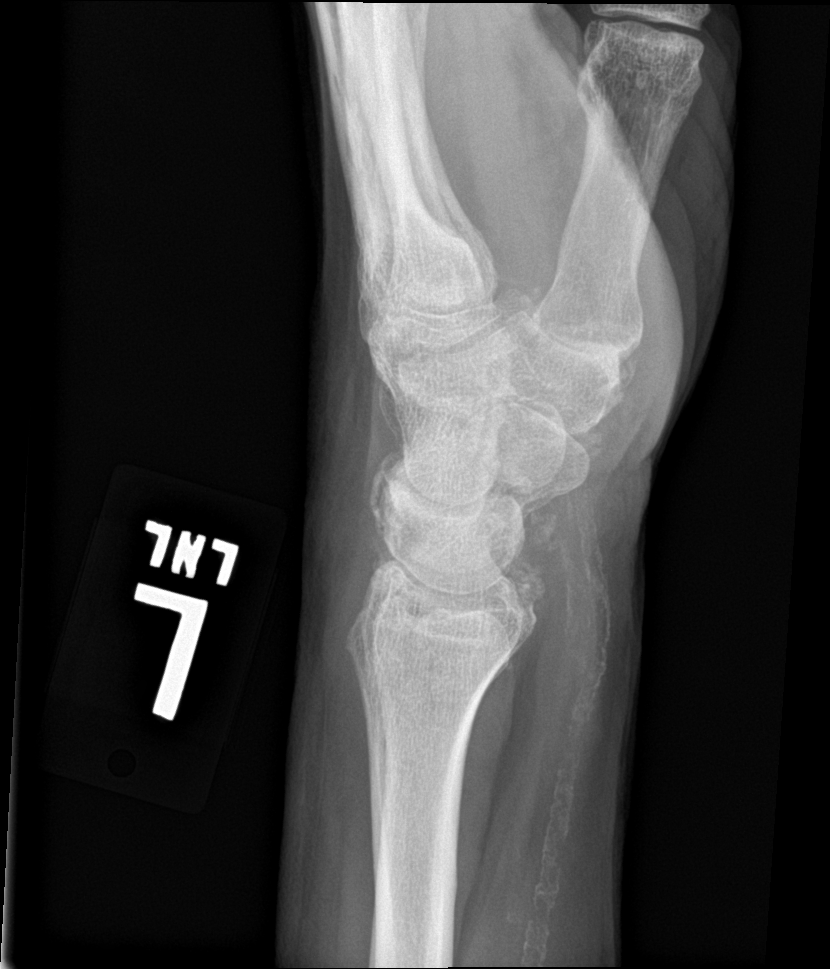

[wrist navicular]
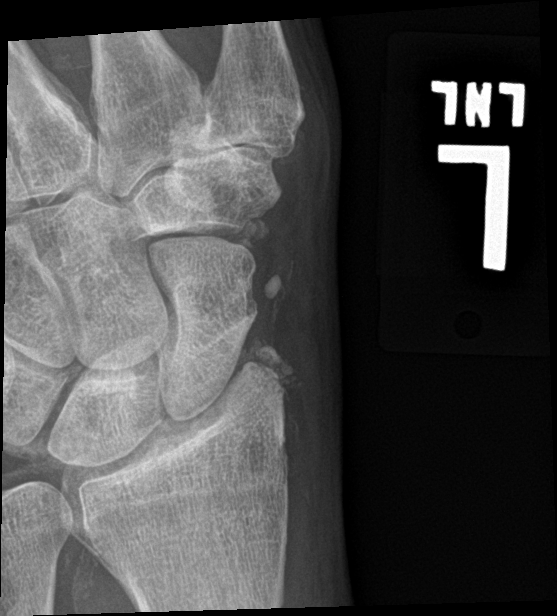

[4 of 4 positions shown; findings below may reference images not displayed]

FINDINGS: Degenerative changes are noted the first CMC joint as well as the
radiocarpal joint. Cartilaginous calcifications are noted. No acute
fracture or dislocation is seen.
IMPRESSION: Chronic changes without acute abnormality.

## 2020-08-11 IMAGING — CT CT HEAD W/O CM
3 series · 15 of 47 positions shown, 18 images · non-contrast
Comparison: None.

CLINICAL DATA: Fall yesterday with headaches and neck pain, initial
encounter

EXAM:
CT HEAD WITHOUT CONTRAST
CT CERVICAL SPINE WITHOUT CONTRAST
TECHNIQUE: Multidetector CT imaging of the head and cervical spine was
performed following the standard protocol without intravenous
contrast. Multiplanar CT image reconstructions of the cervical spine
were also generated.

[Series 3: head 5.0 h30s · axial · 0.41mm/px · z∈[-73,+67]mm · 9 of 34 slices shown, 12 images]
[im 3/34  brain]
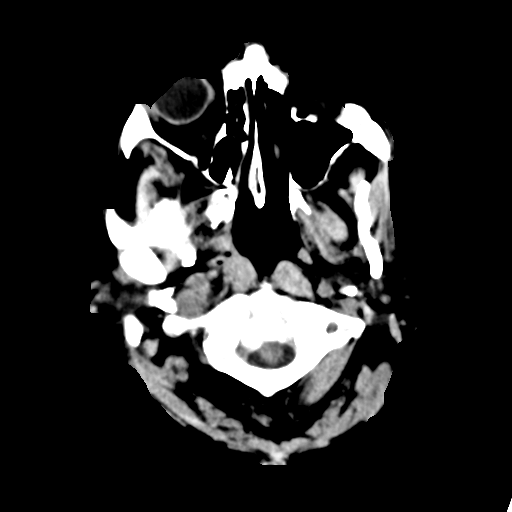
[im 3/34  bone]
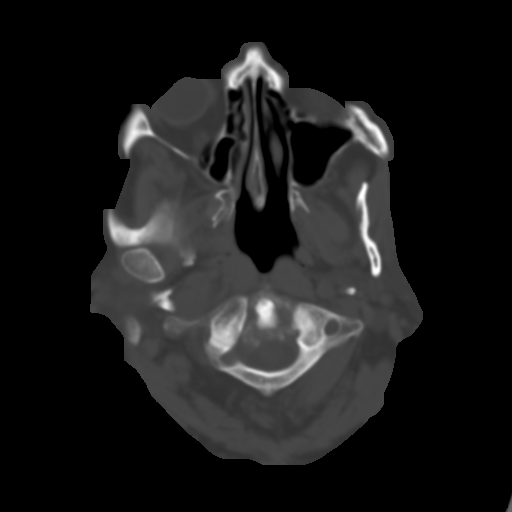
[im 6/34  brain]
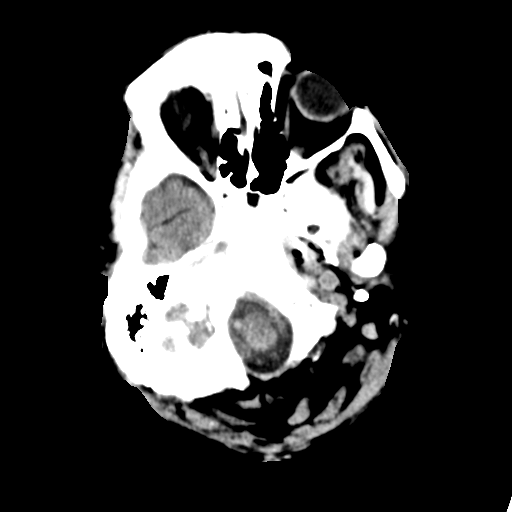
[im 10/34  brain]
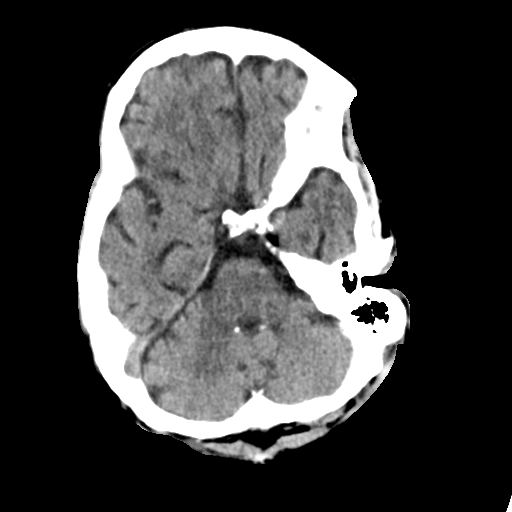
[im 13/34  brain]
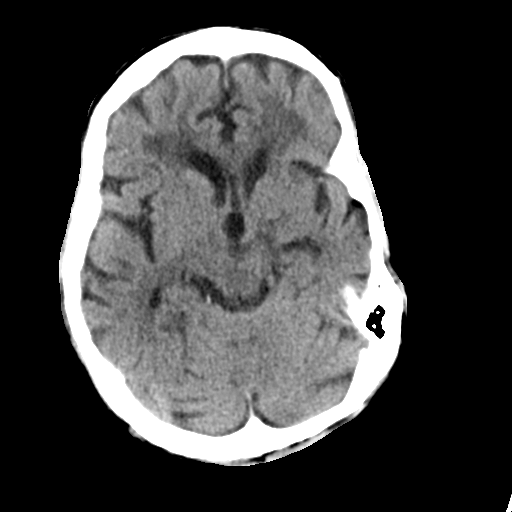
[im 18/34  brain]
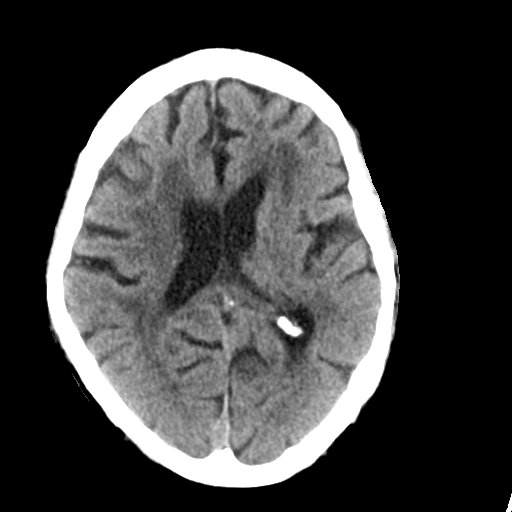
[im 18/34  bone]
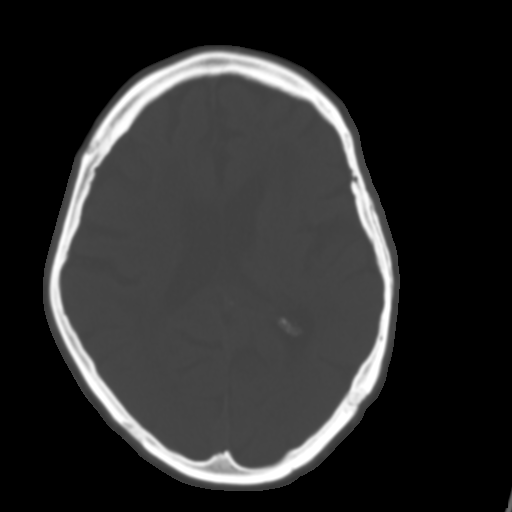
[im 21/34  brain]
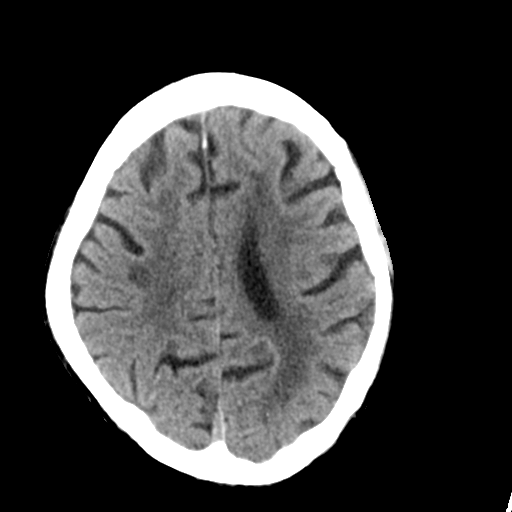
[im 24/34  brain]
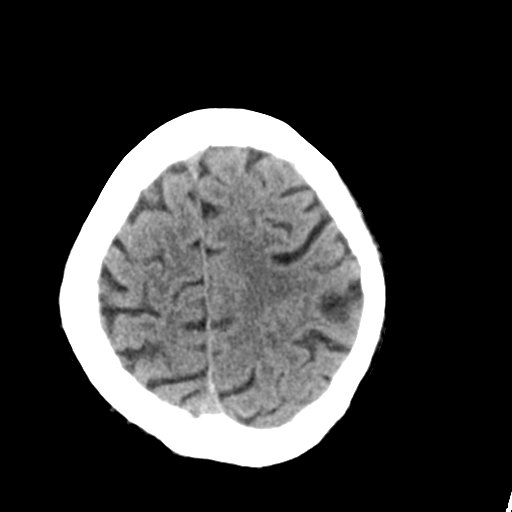
[im 28/34  brain]
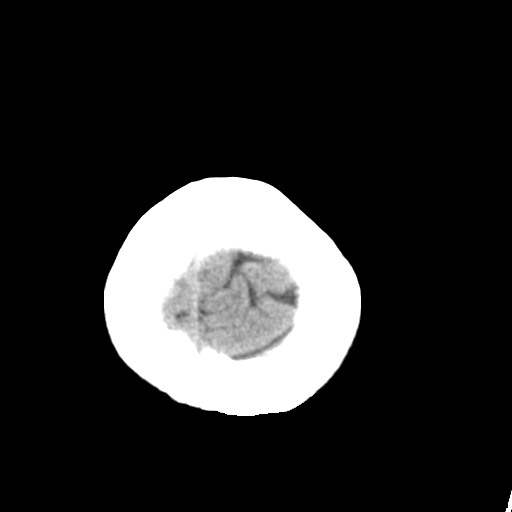
[im 31/34  brain]
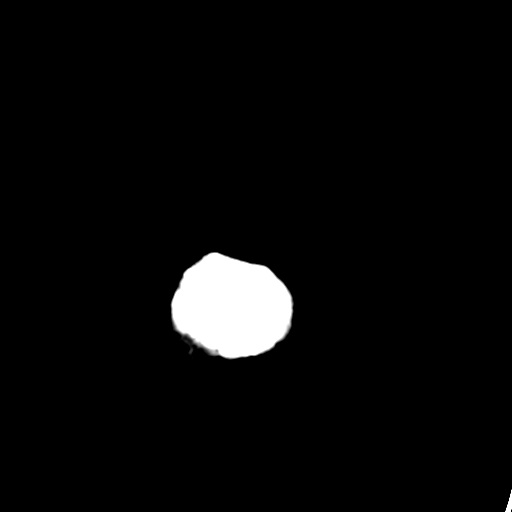
[im 31/34  bone]
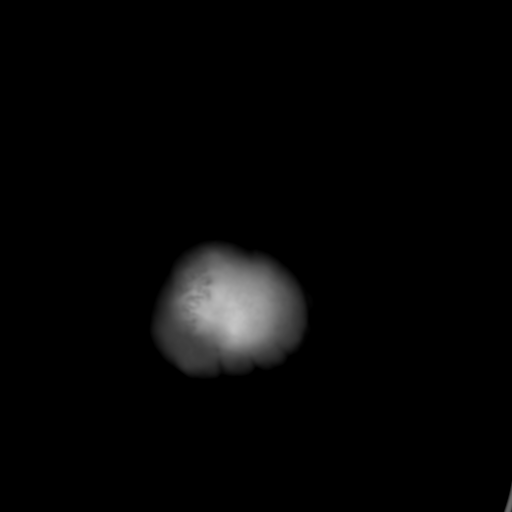

[Series 5: head 3.0 mpr cor · coronal · 0.30mm/px · 3 of 70 slices shown]
[im 24/70  brain]
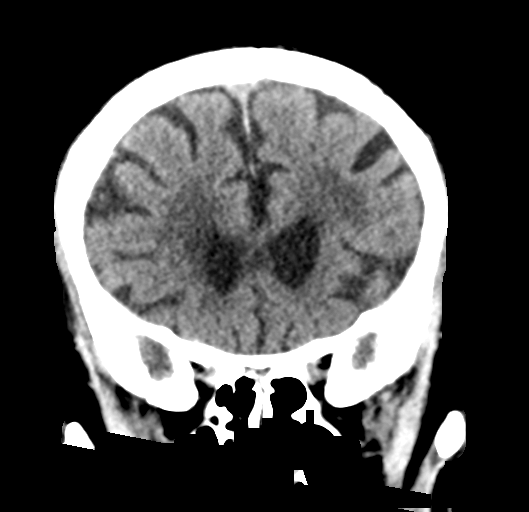
[im 31/70  brain]
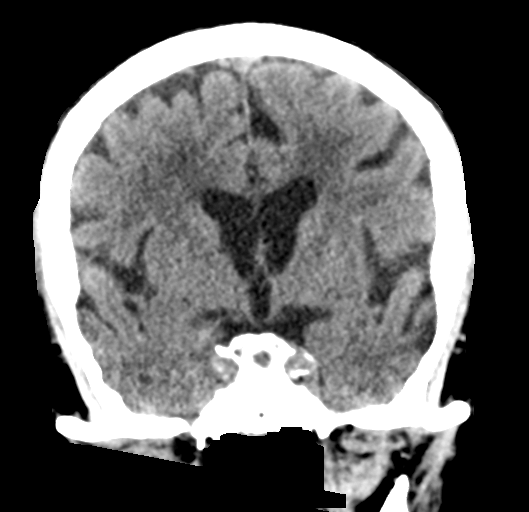
[im 39/70  brain]
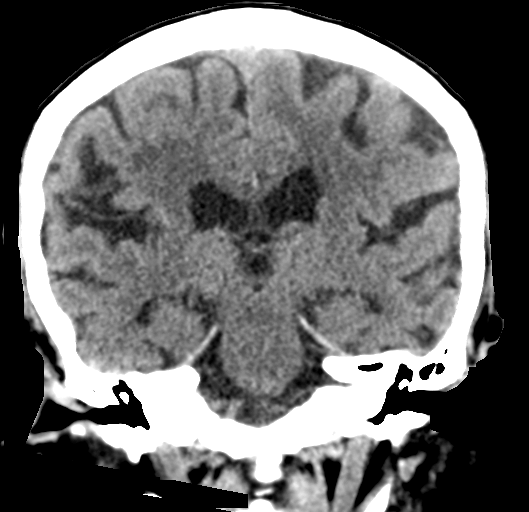

[Series 6: head 3.0 mpr sag · sagittal · 0.30mm/px · 3 of 67 slices shown]
[im 29/67  brain]
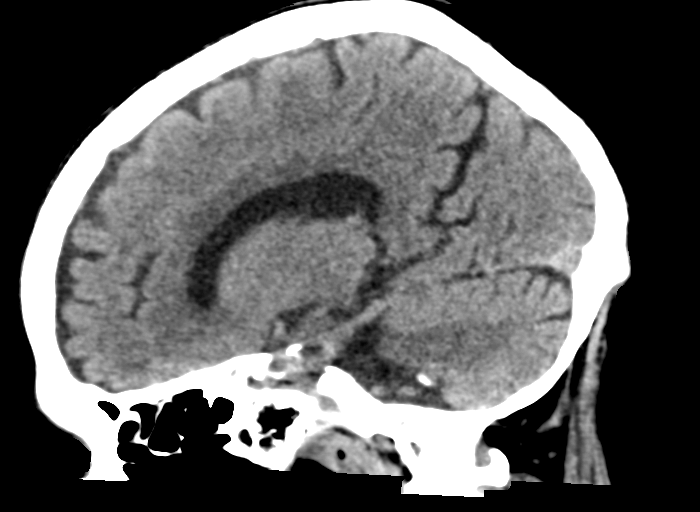
[im 34/67  brain]
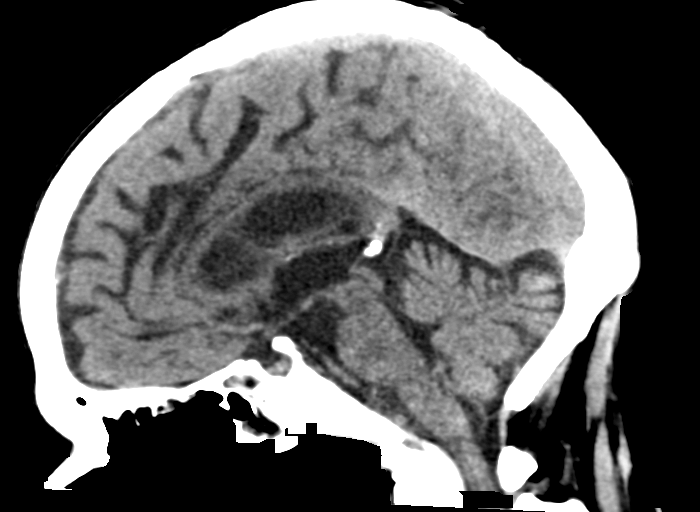
[im 38/67  brain]
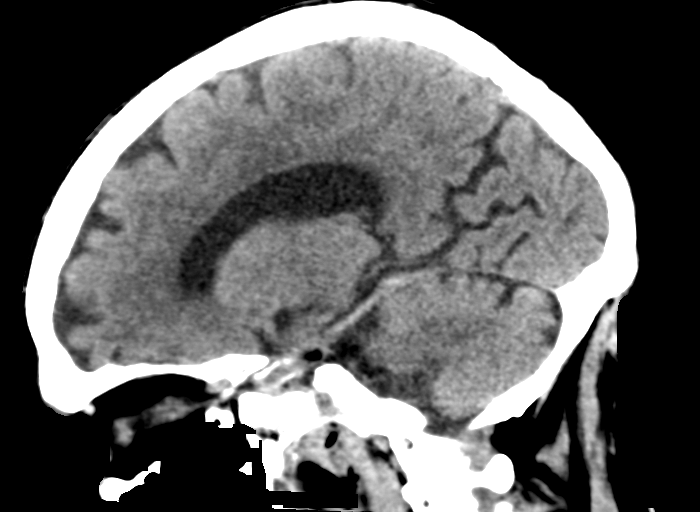

[15 of 47 positions shown; findings below may reference images not displayed]

FINDINGS: CT HEAD FINDINGS

Brain: Chronic atrophic changes and white matter ischemic changes
are noted. No findings to suggest acute hemorrhage, acute infarction
or space-occupying mass lesion are seen.

Vascular: No hyperdense vessel or unexpected calcification.

Skull: Normal. Negative for fracture or focal lesion.

Sinuses/Orbits: No acute finding.

Other: None.

CT CERVICAL SPINE FINDINGS

Alignment: Mild retrolisthesis of C4 with respect to C3 and C5 is
noted.

Skull base and vertebrae: 7 cervical segments are well visualized.
Vertebral body height is well maintained. Multilevel facet
hypertrophic changes and osteophytic changes are seen worst at C4-5.
No acute fracture or acute facet abnormality is noted.

Soft tissues and spinal canal: No soft tissue abnormality is
identified. Mild central canal stenosis is noted related to the
degenerative change.

Upper chest: Negative.

Other: None
IMPRESSION: CT of the head: Chronic changes without acute abnormality.

CT of the cervical spine: Multilevel degenerative changes without
acute bony abnormality.

## 2020-08-11 IMAGING — DX DG SHOULDER 2+V*L*
3 series · 3 of 3 positions shown · non-contrast
Comparison: None.

CLINICAL DATA: Left shoulder pain following fall, initial encounter

EXAM:
LEFT SHOULDER - 2+ VIEW

[shoulder grashey (1 of 2)]
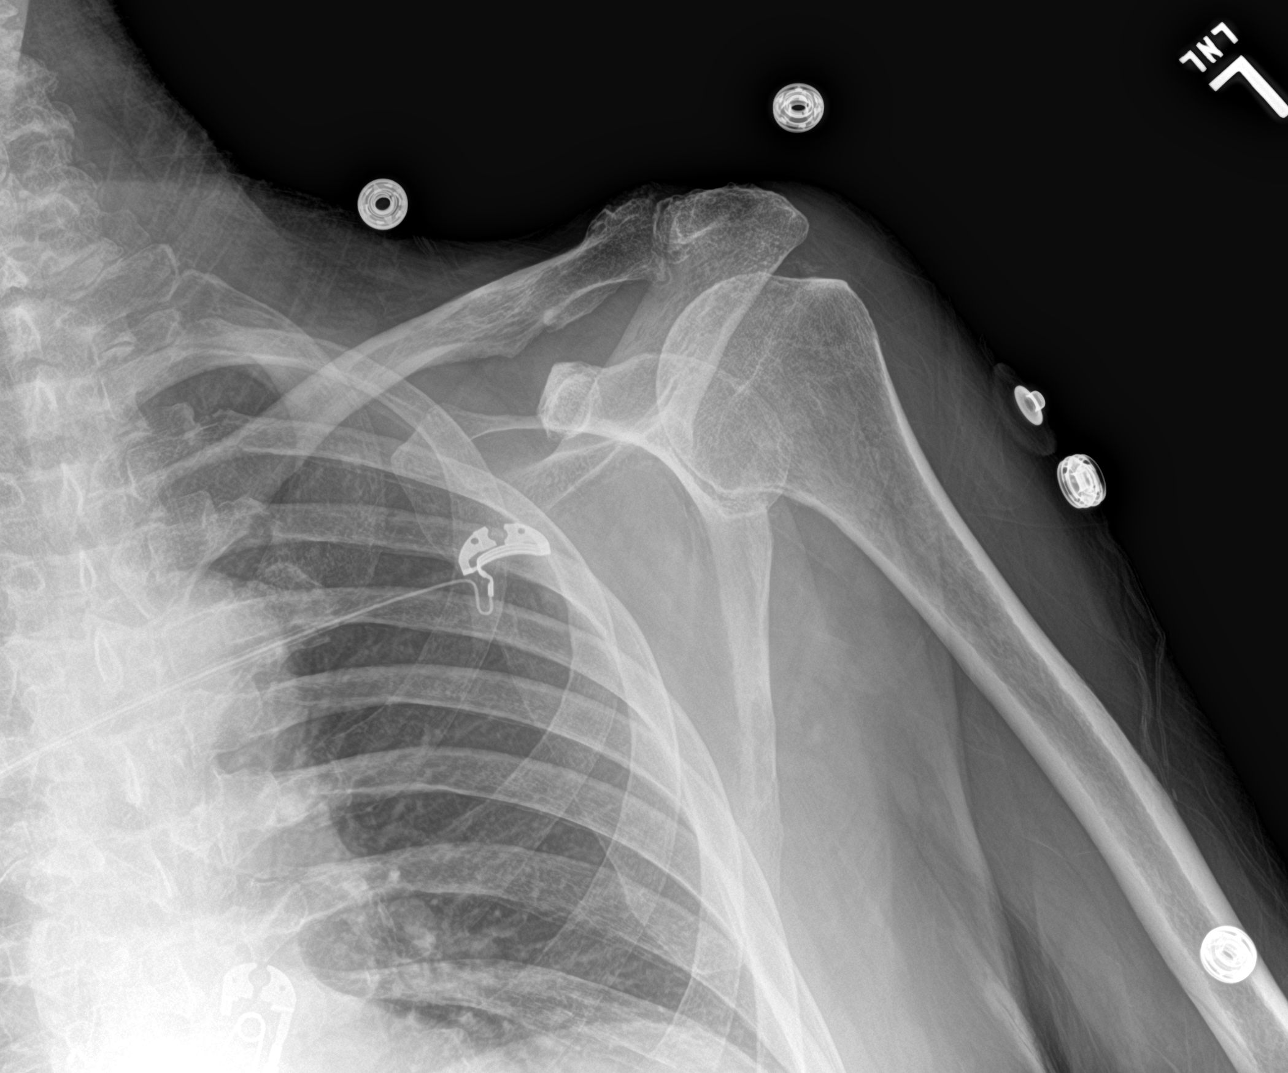

[shoulder y view]
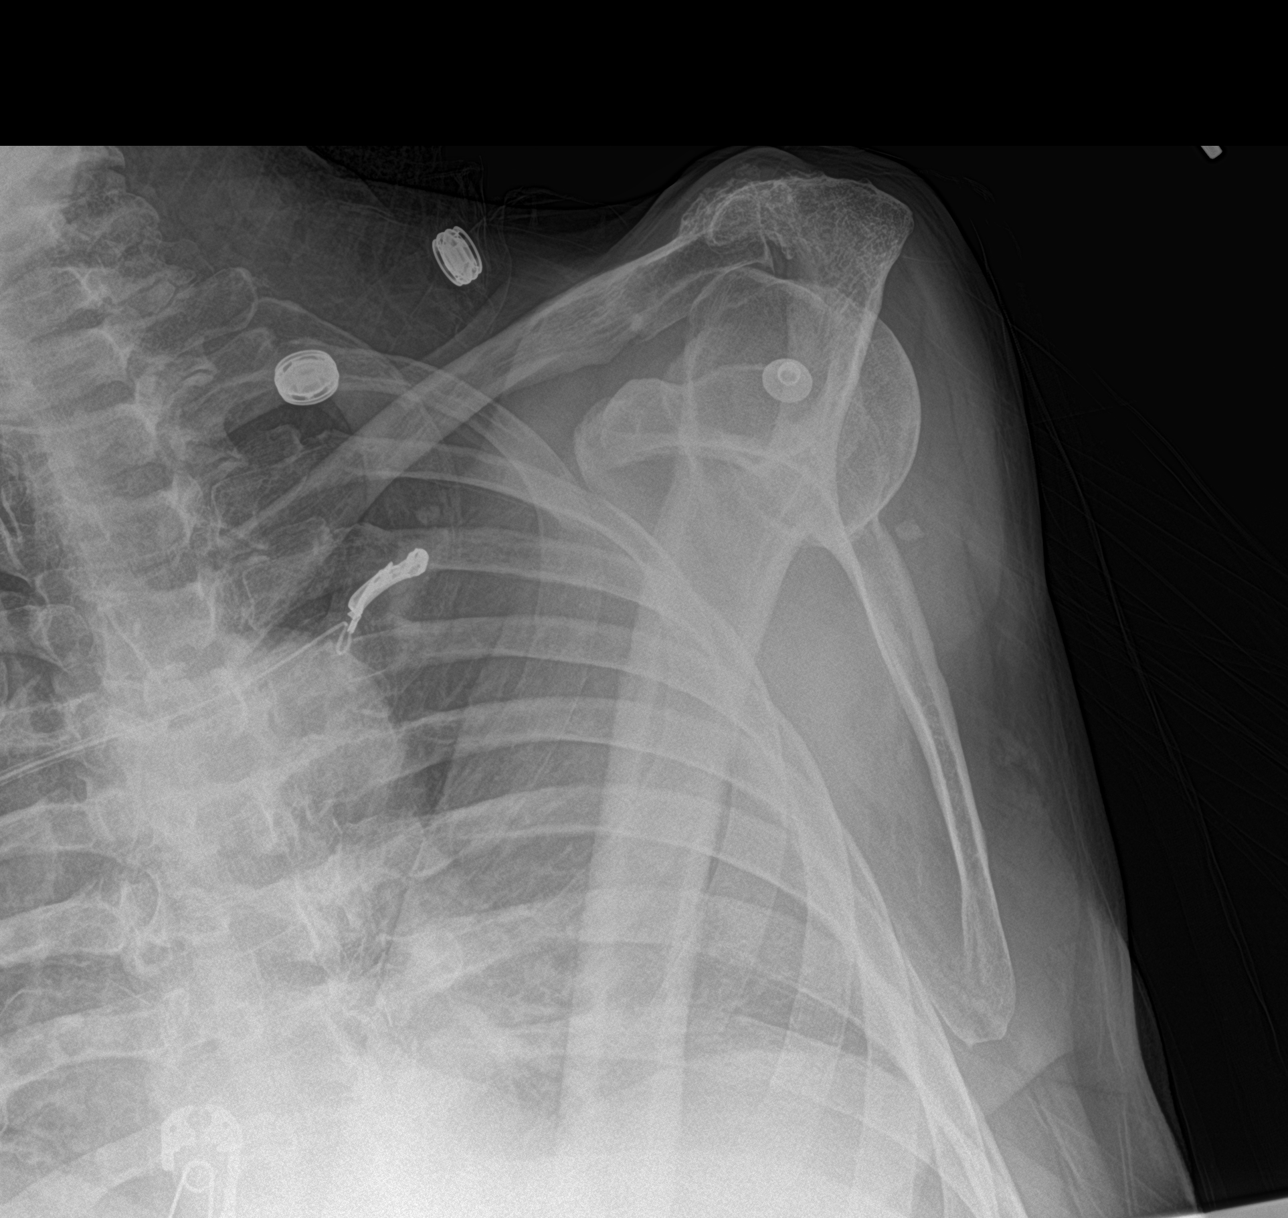

[shoulder grashey (2 of 2)]
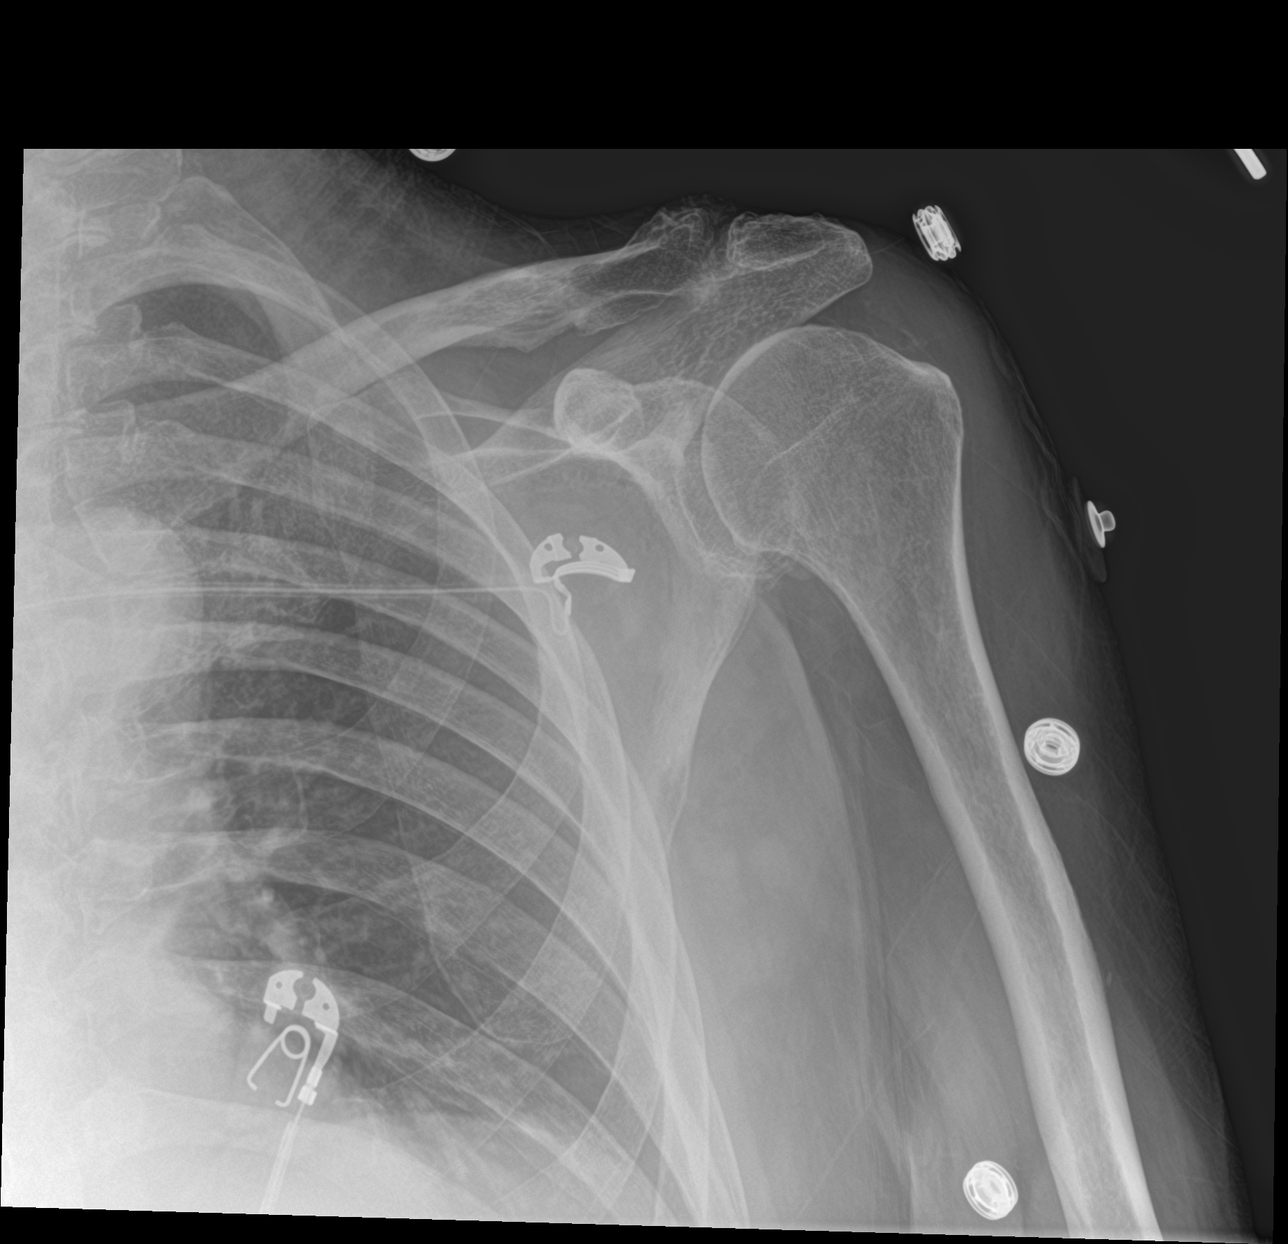

[3 of 3 positions shown; findings below may reference images not displayed]

FINDINGS: Degenerative changes of the acromioclavicular joint are seen. No
acute fracture or dislocation is noted. No soft tissue abnormality
is noted.
IMPRESSION: Chronic changes without acute abnormality.
